# Patient Record
Sex: Male | Born: 1950 | Race: White | Hispanic: No | Marital: Married | Smoking: Never smoker
Health system: Southern US, Community
[De-identification: ages and names within clinical notes are randomized; demographics above are authoritative.]

## PROBLEM LIST (undated history)

## (undated) DIAGNOSIS — M1990 Unspecified osteoarthritis, unspecified site: Secondary | ICD-10-CM

## (undated) DIAGNOSIS — I219 Acute myocardial infarction, unspecified: Secondary | ICD-10-CM

## (undated) DIAGNOSIS — G4730 Sleep apnea, unspecified: Secondary | ICD-10-CM

## (undated) DIAGNOSIS — Z8719 Personal history of other diseases of the digestive system: Secondary | ICD-10-CM

## (undated) DIAGNOSIS — K219 Gastro-esophageal reflux disease without esophagitis: Secondary | ICD-10-CM

## (undated) DIAGNOSIS — I10 Essential (primary) hypertension: Secondary | ICD-10-CM

## (undated) HISTORY — PX: EYE SURGERY: SHX253

## (undated) HISTORY — PX: SPINAL CORD STIMULATOR IMPLANT: SHX2422

## (undated) HISTORY — PX: BACK SURGERY: SHX140

---

## 2012-08-13 HISTORY — PX: CORONARY STENT PLACEMENT: SHX1402

## 2017-07-09 ENCOUNTER — Other Ambulatory Visit: Payer: Self-pay | Admitting: Orthopaedic Surgery

## 2017-07-09 DIAGNOSIS — M4326 Fusion of spine, lumbar region: Secondary | ICD-10-CM

## 2017-09-20 NOTE — Patient Instructions (Signed)
Patient notified 13-hr prep called in to his pharmacy in Epic.  Prednisone 50mg  PO 10/03/17 @ 2030, 10/04/17 @ 0230 and 0830.  Benadryl 50mg  PO 10/04/17 @ 0830.  Knows to stop Plavix 09/28/17.  Donell SievertJeanne Vishal Sandlin, RN

## 2017-10-04 ENCOUNTER — Other Ambulatory Visit: Payer: Self-pay

## 2017-10-04 ENCOUNTER — Inpatient Hospital Stay
Admission: RE | Admit: 2017-10-04 | Discharge: 2017-10-04 | Disposition: A | Discharge status: Home / Self Care | Payer: Self-pay | Source: Ambulatory Visit | Attending: Orthopaedic Surgery | Admitting: Orthopaedic Surgery

## 2017-10-04 NOTE — Discharge Instructions (Signed)

## 2017-10-16 ENCOUNTER — Ambulatory Visit
Admission: RE | Admit: 2017-10-16 | Discharge: 2017-10-16 | Disposition: A | Discharge status: Home / Self Care | Payer: Medicare Other | Source: Ambulatory Visit | Attending: Orthopaedic Surgery | Admitting: Orthopaedic Surgery

## 2017-10-16 DIAGNOSIS — M4326 Fusion of spine, lumbar region: Secondary | ICD-10-CM

## 2017-10-16 MED ORDER — DIAZEPAM 5 MG PO TABS
5.0000 mg | ORAL_TABLET | Freq: Once | ORAL | Status: AC
  Administered 2017-10-16: 5 mg via ORAL

## 2017-10-16 MED ORDER — IOPAMIDOL (ISOVUE-M 300) INJECTION 61%
10.0000 mL | Freq: Once | INTRAMUSCULAR | Status: AC | PRN
  Administered 2017-10-16: 10 mL via INTRATHECAL

## 2017-10-16 NOTE — Discharge Instructions (Signed)

## 2020-11-09 ENCOUNTER — Ambulatory Visit: Payer: Medicare Other | Admitting: Orthopaedic Surgery

## 2020-11-09 ENCOUNTER — Encounter: Payer: Self-pay | Admitting: Orthopaedic Surgery

## 2020-11-09 ENCOUNTER — Ambulatory Visit: Payer: Self-pay

## 2020-11-09 VITALS — Ht 72.0 in | Wt 273.0 lb

## 2020-11-09 DIAGNOSIS — M1611 Unilateral primary osteoarthritis, right hip: Secondary | ICD-10-CM | POA: Diagnosis not present

## 2020-11-09 DIAGNOSIS — M25551 Pain in right hip: Secondary | ICD-10-CM

## 2020-11-09 NOTE — Progress Notes (Signed)
Office Visit Note   Patient: Jordan [name]           Date of Birth: 04-Aug-1951           MRN: [id number] Visit Date: 11/09/2020              Requested by: Stefanie Libel, MD [address] [place] ,  [place] [address] PCP: Stefanie Libel, MD   Assessment & Plan: Visit Diagnoses:  1. Pain in right hip   2. Unilateral primary osteoarthritis, right hip     Plan: I did speak to him in length in detail about hip replacement surgery.  I went over his x-rays in detail and described what the surgery involves.  He would need to stop Plavix for a week prior to surgery.  I gave him a handout about hip replacement surgery and showed him a hip model.  We would likely need to place a dual mobility acetabular component given his spinal fusion.  The risk and benefits of surgery were explained in detail and I talked about his intraoperative and postoperative course and what to expect.  He is going to hold off until may be later in the year.  He has her surgery scheduler's card and if things worsen and he would like to have something scheduled he will let us know.  All questions and concerns were answered and addressed.  Follow-Up Instructions: Return if symptoms worsen or fail to improve.   Orders:  Orders Placed This Encounter  Procedures  . XR HIP UNILAT W OR W/O PELVIS 1V RIGHT   No orders of the defined types were placed in this encounter.     Procedures: No procedures performed   Clinical Data: No additional findings.   Subjective: Chief Complaint  Patient presents with  . Right Hip - Pain  Patient is a very pleasant 70 year old gentleman sent by Dr. Retia Passe of spine and scoliosis specialist to evaluate and treat severe arthritis in the right hip.  He has been dealing with this for many years now.  An intra-articular injection of a steroid in the right hip did help temporize his symptoms for a little while.  He has a significant spine history in terms of a lumbar fusion that extends all the way  into the pelvis and the SI joints.  His pain in his right hip is is in his groin.  He does still have some back pain.  He is not a diabetic.  He is on Plavix.  His wife is with him today.  He does walk with a cane.  He is worked on activity modification and weight loss.  He cannot take anti-inflammatories since being on Plavix.  He has been through therapy as well.  At this point his hip pain is detriment affecting his mobility, his quality of life and his activities of daily living.  HPI  Review of Systems He currently denies any headache, chest pain, short of breath, fever, chills, nausea, vomiting  Objective: Vital Signs: Ht 6' (1.829 m)   Wt 273 lb (123.8 kg)   BMI 37.03 kg/m   Physical Exam He is alert and orient x3 and in no acute distress Ortho Exam Examination of his left hip shows it moves smoothly and fluidly.  Examination of the right hip shows significant stiffness with flexion and extension as well as internal and external rotation and pain in the groin. I did watch him walk based on his spinal fusion for making a better assessment  about what hip replacement we were appropriate for him in terms of the acetabular component positioning given his fusion to the sacrum. Specialty Comments:  No specialty comments available.  Imaging: No results found.   PMFS History: Patient Active Problem List   Diagnosis Date Noted  . Unilateral primary osteoarthritis, right hip 11/09/2020   No past medical history on file.  No family history on file.   Social History   Occupational History  . Not on file  Tobacco Use  . Smoking status: Not on file  . Smokeless tobacco: Not on file  Substance and Sexual Activity  . Alcohol use: Not on file  . Drug use: Not on file  . Sexual activity: Not on file

## 2020-12-12 ENCOUNTER — Other Ambulatory Visit: Payer: Self-pay

## 2020-12-29 ENCOUNTER — Other Ambulatory Visit: Payer: Self-pay | Admitting: Physician Assistant

## 2021-01-13 NOTE — Pre-Procedure Instructions (Addendum)
Surgical Instructions    Your procedure is scheduled on Tuesday June 7th.  Report to [facility] Main Entrance "A" at 09:45 A.M., then check in with the Admitting office.  Call this number if you have problems the morning of surgery:  [phone]   If you have any questions prior to your surgery date call [phone]: Open Monday-Friday 8am-4pm    Remember:  Do not eat after midnight the night before your surgery  You may drink clear liquids until 08:45 the morning of your surgery.   Clear liquids allowed are: Water, Non-Citrus Juices (without pulp), Carbonated Beverages, Clear Tea, Black Coffee Only, and Gatorade Patient Instructions  . The night before surgery:  o No food after midnight. ONLY clear liquids after midnight  . The day of surgery (if you do NOT have diabetes):  o Drink ONE (1) Pre-Surgery Clear Ensure by 08:45 the morning of surgery. Drink in one sitting. Do not sip.  o This drink was given to you during your hospital  pre-op appointment visit.  o Nothing else to drink after completing the  Pre-Surgery Clear Ensure.         If you have questions, please contact your surgeon's office.     Take these medicines the morning of surgery with A SIP OF WATER   amLODipine (NORVASC)  cyclobenzaprine (FLEXERIL)- If needed  metoprolol succinate (TOPROL-XL)  omeprazole (PRILOSEC)  pravastatin (PRAVACHOL)  tamsulosin (FLOMAX)    As of today, STOP taking any Aspirin (unless otherwise instructed by your surgeon) Aleve, Naproxen, Ibuprofen, Motrin, Advil, Goody's, BC's, all herbal medications, fish oil, and all vitamins.  Per your doctor's instructions hold Plavix for 5 days prior to surgery date.                      Do NOT Smoke (Tobacco/Vaping) or drink Alcohol 24 hours prior to your procedure.  If you use a CPAP at night, you may bring all equipment for your overnight stay.   Contacts, glasses, piercing's, hearing aid's, dentures or partials may not be worn into  surgery, please bring cases for these belongings.    For patients admitted to the hospital, discharge time will be determined by your treatment team.   Patients discharged the day of surgery will not be allowed to drive home, and someone needs to stay with them for 24 hours.    Special instructions:   - Preparing For Surgery  Before surgery, you can play an important role. Because skin is not sterile, your skin needs to be as free of germs as possible. You can reduce the number of germs on your skin by washing with CHG (chlorahexidine gluconate) Soap before surgery.  CHG is an antiseptic cleaner which kills germs and bonds with the skin to continue killing germs even after washing.    Oral Hygiene is also important to reduce your risk of infection.  Remember - BRUSH YOUR TEETH THE MORNING OF SURGERY WITH YOUR REGULAR TOOTHPASTE  Please do not use if you have an allergy to CHG or antibacterial soaps. If your skin becomes reddened/irritated stop using the CHG.  Do not shave (including legs and underarms) for at least 48 hours prior to first CHG shower. It is OK to shave your face.  Please follow these instructions carefully.   1. Shower the NIGHT BEFORE SURGERY and the MORNING OF SURGERY  2. If you chose to wash your hair, wash your hair first as usual with your normal shampoo.  3. After you shampoo, rinse your hair and body thoroughly to remove the shampoo.  4. Use CHG Soap as you would any other liquid soap. You can apply CHG directly to the skin and wash gently with a scrungie or a clean washcloth.   5. Apply the CHG Soap to your body ONLY FROM THE NECK DOWN.  Do not use on open wounds or open sores. Avoid contact with your eyes, ears, mouth and genitals (private parts). Wash Face and genitals (private parts)  with your normal soap.   6. Wash thoroughly, paying special attention to the area where your surgery will be performed.  7. Thoroughly rinse your body with warm  water from the neck down.  8. DO NOT shower/wash with your normal soap after using and rinsing off the CHG Soap.  9. Pat yourself dry with a CLEAN TOWEL.  10. Wear CLEAN PAJAMAS to bed the night before surgery  11. Place CLEAN SHEETS on your bed the night before your surgery  12. DO NOT SLEEP WITH PETS.   Day of Surgery: Shower with CHG soap. Do not wear jewelry, make up, or nail polish Do not wear lotions, powders, colognes, or deodorant. Do not shave 48 hours prior to surgery.  Men may shave face and neck. Do not bring valuables to the hospital. [facility] is not responsible for any belongings or valuables. Wear Clean/Comfortable clothing the morning of surgery Remember to brush your teeth WITH YOUR REGULAR TOOTHPASTE.   Please read over the following fact sheets that you were given.

## 2021-01-16 ENCOUNTER — Encounter (HOSPITAL_COMMUNITY): Payer: Self-pay

## 2021-01-16 ENCOUNTER — Encounter (HOSPITAL_COMMUNITY)
Admission: RE | Admit: 2021-01-16 | Discharge: 2021-01-16 | Disposition: A | Discharge status: Home / Self Care | Payer: Medicare Other | Source: Ambulatory Visit | Attending: Orthopaedic Surgery | Admitting: Orthopaedic Surgery

## 2021-01-16 ENCOUNTER — Other Ambulatory Visit: Payer: Self-pay

## 2021-01-16 DIAGNOSIS — Z20822 Contact with and (suspected) exposure to covid-19: Secondary | ICD-10-CM | POA: Insufficient documentation

## 2021-01-16 DIAGNOSIS — Z01812 Encounter for preprocedural laboratory examination: Secondary | ICD-10-CM | POA: Insufficient documentation

## 2021-01-16 HISTORY — DX: Essential (primary) hypertension: I10

## 2021-01-16 HISTORY — DX: Gastro-esophageal reflux disease without esophagitis: K21.9

## 2021-01-16 HISTORY — DX: Acute myocardial infarction, unspecified: I21.9

## 2021-01-16 HISTORY — DX: Sleep apnea, unspecified: G47.30

## 2021-01-16 HISTORY — DX: Unspecified osteoarthritis, unspecified site: M19.90

## 2021-01-16 LAB — BASIC METABOLIC PANEL
Anion gap: 9 (ref 5–15)
BUN: 21 mg/dL (ref 8–23)
CO2: 24 mmol/L (ref 22–32)
Calcium: 9.2 mg/dL (ref 8.9–10.3)
Chloride: 105 mmol/L (ref 98–111)
Creatinine, Ser: 1 mg/dL (ref 0.61–1.24)
GFR, Estimated: >60 mL/min (ref >60)
Glucose, Bld: 131 mg/dL — ABNORMAL HIGH (ref 70–99)
Potassium: 3.5 mmol/L (ref 3.5–5.1)
Sodium: 138 mmol/L (ref 135–145)

## 2021-01-16 LAB — CBC
HCT: 43.8 % (ref 39.0–52.0)
Hemoglobin: 14.2 g/dL (ref 13.0–17.0)
MCH: 30.7 pg (ref 26.0–34.0)
MCHC: 32.4 g/dL (ref 30.0–36.0)
MCV: 94.8 fL (ref 80.0–100.0)
Platelets: 243 10*3/uL (ref 150–400)
RBC: 4.62 MIL/uL (ref 4.22–5.81)
RDW: 13.2 % (ref 11.5–15.5)
WBC: 6.6 10*3/uL (ref 4.0–10.5)
nRBC: 0 % (ref 0.0–0.2)

## 2021-01-16 LAB — TYPE AND SCREEN
ABO/RH(D): O POS
Antibody Screen: NEGATIVE

## 2021-01-16 LAB — SURGICAL PCR SCREEN
MRSA, PCR: NEGATIVE
Staphylococcus aureus: NEGATIVE

## 2021-01-16 NOTE — Progress Notes (Addendum)
PCP - Dr. Stefanie Libel Cardiologist - Dr. Lorelei Pont  PPM/ICD - n/a Device Orders - n/a Rep Notified - n/a  Chest x-ray -  EKG - 10/25/20 at [facility] Stress Test -  ECHO - 05/28/18 Cardiac Cath -   Sleep Study - Yes. Over 20 years ago. Sleep Apnea CPAP - Wears nightly  Fasting Blood Sugar - n/a   Blood Thinner Instructions: Patient states he stopped taking Aspirin and Plavix on 01/09/20  ERAS Protcol - Yes PRE-SURGERY Ensure or G2- Ensure  COVID TEST- 01/16/21. Pending   Anesthesia review: Yes. EKG tracing and ECHO and stress requested from [facility] [place]. Message sent to Jaynie Collins, PA to make aware patient is for anesthesia review.   Patient denies shortness of breath, fever, cough and chest pain at PAT appointment   All instructions explained to the patient, with a verbal understanding of the material. Patient agrees to go over the instructions while at home for a better understanding. Patient also instructed to self quarantine after being tested for COVID-19. The opportunity to ask questions was provided.

## 2021-01-16 NOTE — Progress Notes (Signed)
Anesthesia Chart Review:  Case: [id number] Date/Time: 01/17/21 1130   Procedure: RIGHT TOTAL HIP ARTHROPLASTY ANTERIOR APPROACH (Right Hip)   Anesthesia type: Spinal   Pre-op diagnosis: Osteoarthritis Right Hip   [facility] ROOM 04 / [facility]   Surgeons: Kathryne Hitch, MD      DISCUSSION: Patient is a 70 year old male scheduled for the above procedure.  History includes never smoker, HTN, dyslipidemia, CAD (NSTEMI, s/p DES x2 LAD 09/30/13), OSA (uses CPAP), GERD, back surgery. BMI is consistent with obesity.  Last cardiology visit with Dr. Willeen Cass on 10/25/20. He was doing well without chest pain. Patient had been of cardiac medications, so Toprol and pravastatin resumed. Six month follow-up planned.  He signed a note of clearance for surgery with permission to hold Plavix for 5 days prior to surgery.   He reported last ASA and Plavix 5/29.   01/16/21 presurgical COVID-19 test in process. Anesthesia team to evaluate on the day of surgery.   VS: BP 138/70   Pulse 67   Temp 36.9 C (Oral)   Resp 18   Ht 5\' 11"  (1.803 m)   Wt 125.1 kg   SpO2 99%   BMI 38.48 kg/m   PROVIDERS: , MD is PCP Stefanie Libel, MD is cardiologist ([facility])   LABS: Labs reviewed: Acceptable for surgery. (all labs ordered are listed, but only abnormal results are displayed)  Labs Reviewed  BASIC METABOLIC PANEL - Abnormal; Notable for the following components:      Result Value   Glucose, Bld 131 (*)    All other components within normal limits  SURGICAL PCR SCREEN  SARS CORONAVIRUS 2 (TAT 6-24 HRS)  CBC  TYPE AND SCREEN     EKG: 10/25/20: NSR, RBBB   CV: Echo 03/16/20 ([facility]): LeftVentricle: The calculated left ventricular ejection fraction is  61%.  . LeftVentricle: There is moderate concentric hypertrophy.  . LeftVentricle: Systolic function is normal. EF: 60-65%. ; GLS = 22.7%  from the apical 4,3,2 chamber views respectively.  05/16/20 LeftVentricle: Doppler parameters  are indeterminate for diastolic  function.  . RightVentricle: Right ventricle is mildly dilated.  . MitralValve: The leaflets are mildly thickened.  . Aorta: The ascending aorta is mildly dilated.  . TricuspidValve: The right ventricular systolic pressure is normal (<36  mmHg).  . PulmonicValve: The pulmonic valve was not well visualized.   Exercise stress echo 06/13/15 [facility]): STRESS ECHO  Normal left ventricular function and global wall motion with stress. There  were no segmental wall motion abnormalities post exercise. There was normal  increase in global LV function post exercise. The estimated LV ejection  fraction is 60-65% with stress. Negative exercise echocardiography for  inducible ischemia at subtarget heart rate achieved.    Cardiac cath 04/22/14 ([facility]): Impression:  1. Patent LAD stent. Borderline lesion noted in the proximal portion of the IM branch. Mild RCA lesion noted  2. Normal LV function   Plan:  1. Aggressive medical therapy.Would consider pressure wire measurement if he continues to have chest pain.  2. Risk stratification   Cardiac cath 09/30/13 ([facility]): Findings:  1. Hemodynamics: Aortic pressure 174/71, LVEDP 22  2. Coronary system:  Left Main: Large caliber vessel with no angiographic evidence of stenosis.  LAD system: Large caliber vessel, wraps around the apex. 540% ostial disease, 99% proximal stenosis with probable thrombus  LCX system: moderate caliber vessel.40% proximal disease.  RCA system: right dominant. 60% distal stenosis.   Conclusion:  1. Successful intervention to the LAD artery using 2.75x12 and 2.75x18 mm Alpine DESstent. Lesion description: Type C, eccentric, lesion length about 22 mm.Pre-intervention stenosis 99% with probable thrombus, post-intervention stenosis 0 %    Past Medical History:  Diagnosis Date  . Arthritis    Back, hand, elbows, knees, hips  . GERD (gastroesophageal reflux  disease)   . Hypertension   . Myocardial infarction (HCC)   . Sleep apnea     Past Surgical History:  Procedure Laterality Date  . BACK SURGERY     Lower back fusion  . CORONARY STENT PLACEMENT  2014  . EYE SURGERY     Bilateral cataract removal    MEDICATIONS: . amLODipine (NORVASC) 10 MG tablet  . aspirin 81 MG chewable tablet  . clopidogrel (PLAVIX) 75 MG tablet  . clotrimazole-betamethasone (LOTRISONE) cream  . cyclobenzaprine (FLEXERIL) 10 MG tablet  . furosemide (LASIX) 20 MG tablet  . HYDROcodone-ibuprofen (VICOPROFEN) 7.5-200 MG tablet  . losartan (COZAAR) 100 MG tablet  . metoprolol succinate (TOPROL-XL) 50 MG 24 hr tablet  . naproxen (NAPROSYN) 500 MG tablet  . omeprazole (PRILOSEC) 20 MG capsule  . pravastatin (PRAVACHOL) 20 MG tablet  . Sennosides 25 MG TABS  . tamsulosin (FLOMAX) 0.4 MG CAPS capsule   No current facility-administered medications for this encounter.    [name], PA-C Surgical Short Stay/Anesthesiology [facility] Phone [phone] [facility] Phone [phone] 01/16/2021 3:50 PM

## 2021-01-16 NOTE — Anesthesia Preprocedure Evaluation (Addendum)
Anesthesia Evaluation  Patient identified by MRN, date of birth, ID band Patient awake    Reviewed: Allergy & Precautions, NPO status , Patient's Chart, lab work & pertinent test results  Airway Mallampati: III  TM Distance: >3 FB Neck ROM: Full    Dental  (+) Teeth Intact, Dental Advisory Given   Pulmonary sleep apnea ,    breath sounds clear to auscultation       Cardiovascular hypertension, Pt. on medications and Pt. on home beta blockers + Past MI   Rhythm:Regular Rate:Normal     Neuro/Psych negative neurological ROS  negative psych ROS   GI/Hepatic Neg liver ROS, GERD  Medicated,  Endo/Other  negative endocrine ROS  Renal/GU negative Renal ROS     Musculoskeletal  (+) Arthritis ,   Abdominal Normal abdominal exam  (+)   Peds  Hematology negative hematology ROS (+)   Anesthesia Other Findings   Reproductive/Obstetrics                           Anesthesia Physical Anesthesia Plan  ASA: II  Anesthesia Plan: Spinal   Post-op Pain Management:    Induction: Intravenous  PONV Risk Score and Plan: 2 and Ondansetron and Propofol infusion  Airway Management Planned: Natural Airway and Simple Face Mask  Additional Equipment: None  Intra-op Plan:   Post-operative Plan:   Informed Consent: I have reviewed the patients History and Physical, chart, labs and discussed the procedure including the risks, benefits and alternatives for the proposed anesthesia with the patient or authorized representative who has indicated his/her understanding and acceptance.       Plan Discussed with: CRNA  Anesthesia Plan Comments: (PAT note written 01/16/2021 by [name], PA-C. )       Anesthesia Quick Evaluation

## 2021-01-17 ENCOUNTER — Encounter (HOSPITAL_COMMUNITY)
Admission: RE | Disposition: A | Discharge status: Home / Self Care | Payer: Self-pay | Source: Home / Self Care | Attending: Orthopaedic Surgery

## 2021-01-17 ENCOUNTER — Ambulatory Visit (HOSPITAL_COMMUNITY): Payer: Medicare Other

## 2021-01-17 ENCOUNTER — Ambulatory Visit (HOSPITAL_COMMUNITY): Payer: Medicare Other | Admitting: Vascular Surgery

## 2021-01-17 ENCOUNTER — Ambulatory Visit (HOSPITAL_COMMUNITY): Payer: Medicare Other | Admitting: Anesthesiology

## 2021-01-17 ENCOUNTER — Observation Stay (HOSPITAL_COMMUNITY)
Admission: RE | Admit: 2021-01-17 | Discharge: 2021-01-18 | Disposition: A | Discharge status: Home / Self Care | Payer: Medicare Other | Attending: Orthopaedic Surgery | Admitting: Orthopaedic Surgery

## 2021-01-17 ENCOUNTER — Encounter (HOSPITAL_COMMUNITY): Payer: Self-pay | Admitting: Orthopaedic Surgery

## 2021-01-17 ENCOUNTER — Observation Stay (HOSPITAL_COMMUNITY): Payer: Medicare Other

## 2021-01-17 DIAGNOSIS — I10 Essential (primary) hypertension: Secondary | ICD-10-CM | POA: Diagnosis not present

## 2021-01-17 DIAGNOSIS — M1611 Unilateral primary osteoarthritis, right hip: Secondary | ICD-10-CM | POA: Diagnosis not present

## 2021-01-17 DIAGNOSIS — Z955 Presence of coronary angioplasty implant and graft: Secondary | ICD-10-CM | POA: Diagnosis not present

## 2021-01-17 DIAGNOSIS — Z96641 Presence of right artificial hip joint: Secondary | ICD-10-CM

## 2021-01-17 DIAGNOSIS — Z419 Encounter for procedure for purposes other than remedying health state, unspecified: Secondary | ICD-10-CM

## 2021-01-17 HISTORY — PX: TOTAL HIP ARTHROPLASTY: SHX124

## 2021-01-17 LAB — ABO/RH: ABO/RH(D): O POS

## 2021-01-17 LAB — SARS CORONAVIRUS 2 (TAT 6-24 HRS): SARS Coronavirus 2: NEGATIVE

## 2021-01-17 SURGERY — ARTHROPLASTY, HIP, TOTAL, ANTERIOR APPROACH
Anesthesia: General | Site: Hip | Laterality: Right

## 2021-01-17 MED ORDER — AMLODIPINE BESYLATE 5 MG PO TABS
10.0000 mg | ORAL_TABLET | Freq: Every day | ORAL | Status: DC
  Administered 2021-01-17: 10 mg via ORAL
  Filled 2021-01-17 (×2): qty 2

## 2021-01-17 MED ORDER — ACETAMINOPHEN 325 MG PO TABS: 325.0000 mg | ORAL_TABLET | Freq: Once | ORAL | Status: DC | PRN

## 2021-01-17 MED ORDER — ONDANSETRON HCL 4 MG/2ML IJ SOLN: 4.0000 mg | Freq: Four times a day (QID) | INTRAMUSCULAR | Status: DC | PRN

## 2021-01-17 MED ORDER — LACTATED RINGERS IV SOLN: INTRAVENOUS | Status: DC

## 2021-01-17 MED ORDER — MIDAZOLAM HCL 5 MG/5ML IJ SOLN
INTRAMUSCULAR | Status: DC | PRN
  Administered 2021-01-17 (×2): 1 mg via INTRAVENOUS

## 2021-01-17 MED ORDER — SENNA 8.6 MG PO TABS
8.6000 mg | ORAL_TABLET | Freq: Every day | ORAL | Status: DC
  Administered 2021-01-17 – 2021-01-18 (×2): 8.6 mg via ORAL
  Filled 2021-01-17 (×2): qty 1

## 2021-01-17 MED ORDER — OXYCODONE HCL 5 MG PO TABS
10.0000 mg | ORAL_TABLET | ORAL | Status: DC | PRN
  Filled 2021-01-17: qty 3

## 2021-01-17 MED ORDER — PHENYLEPHRINE 40 MCG/ML (10ML) SYRINGE FOR IV PUSH (FOR BLOOD PRESSURE SUPPORT)
PREFILLED_SYRINGE | INTRAVENOUS | Status: AC
  Filled 2021-01-17: qty 10

## 2021-01-17 MED ORDER — LOSARTAN POTASSIUM 50 MG PO TABS
100.0000 mg | ORAL_TABLET | Freq: Every day | ORAL | Status: DC
  Administered 2021-01-17 – 2021-01-18 (×2): 100 mg via ORAL
  Filled 2021-01-17 (×2): qty 2

## 2021-01-17 MED ORDER — CLOTRIMAZOLE-BETAMETHASONE 1-0.05 % EX CREA
1.0000 "application " | TOPICAL_CREAM | Freq: Every day | CUTANEOUS | Status: DC
  Filled 2021-01-17: qty 15

## 2021-01-17 MED ORDER — 0.9 % SODIUM CHLORIDE (POUR BTL) OPTIME
TOPICAL | Status: DC | PRN
  Administered 2021-01-17: 1000 mL

## 2021-01-17 MED ORDER — FLUCONAZOLE 150 MG PO TABS
150.0000 mg | ORAL_TABLET | Freq: Once | ORAL | Status: AC
  Administered 2021-01-17: 150 mg via ORAL
  Filled 2021-01-17: qty 1

## 2021-01-17 MED ORDER — DEXAMETHASONE SODIUM PHOSPHATE 10 MG/ML IJ SOLN
INTRAMUSCULAR | Status: DC | PRN
  Administered 2021-01-17: 5 mg via INTRAVENOUS

## 2021-01-17 MED ORDER — AMISULPRIDE (ANTIEMETIC) 5 MG/2ML IV SOLN: 10.0000 mg | Freq: Once | INTRAVENOUS | Status: DC | PRN

## 2021-01-17 MED ORDER — HYDROMORPHONE HCL 1 MG/ML IJ SOLN
INTRAMUSCULAR | Status: AC
  Filled 2021-01-17: qty 1

## 2021-01-17 MED ORDER — ROCURONIUM BROMIDE 10 MG/ML (PF) SYRINGE
PREFILLED_SYRINGE | INTRAVENOUS | Status: AC
  Filled 2021-01-17: qty 20

## 2021-01-17 MED ORDER — DEXAMETHASONE SODIUM PHOSPHATE 10 MG/ML IJ SOLN
INTRAMUSCULAR | Status: AC
  Filled 2021-01-17: qty 1

## 2021-01-17 MED ORDER — TRANEXAMIC ACID-NACL 1000-0.7 MG/100ML-% IV SOLN
1000.0000 mg | INTRAVENOUS | Status: AC
  Administered 2021-01-17: 1000 mg via INTRAVENOUS

## 2021-01-17 MED ORDER — METOPROLOL SUCCINATE ER 50 MG PO TB24
50.0000 mg | ORAL_TABLET | Freq: Every day | ORAL | Status: DC
  Administered 2021-01-17 – 2021-01-18 (×2): 50 mg via ORAL
  Filled 2021-01-17 (×2): qty 1

## 2021-01-17 MED ORDER — SODIUM CHLORIDE 0.9 % IV SOLN: INTRAVENOUS | Status: DC

## 2021-01-17 MED ORDER — ONDANSETRON HCL 4 MG/2ML IJ SOLN
INTRAMUSCULAR | Status: DC | PRN
  Administered 2021-01-17: 4 mg via INTRAVENOUS

## 2021-01-17 MED ORDER — PANTOPRAZOLE SODIUM 40 MG PO TBEC
40.0000 mg | DELAYED_RELEASE_TABLET | Freq: Every day | ORAL | Status: DC
  Administered 2021-01-17: 40 mg via ORAL
  Filled 2021-01-17 (×2): qty 1

## 2021-01-17 MED ORDER — SUCCINYLCHOLINE CHLORIDE 200 MG/10ML IV SOSY
PREFILLED_SYRINGE | INTRAVENOUS | Status: AC
  Filled 2021-01-17: qty 10

## 2021-01-17 MED ORDER — HYDROMORPHONE HCL 1 MG/ML IJ SOLN
0.2500 mg | INTRAMUSCULAR | Status: DC | PRN
  Administered 2021-01-17: 0.5 mg via INTRAVENOUS

## 2021-01-17 MED ORDER — PHENOL 1.4 % MT LIQD: 1.0000 | OROMUCOSAL | Status: DC | PRN

## 2021-01-17 MED ORDER — FUROSEMIDE 20 MG PO TABS
20.0000 mg | ORAL_TABLET | Freq: Every day | ORAL | Status: DC
  Administered 2021-01-17 – 2021-01-18 (×2): 20 mg via ORAL
  Filled 2021-01-17 (×2): qty 1

## 2021-01-17 MED ORDER — HYDROMORPHONE HCL 1 MG/ML IJ SOLN
INTRAMUSCULAR | Status: AC
  Filled 2021-01-17: qty 2

## 2021-01-17 MED ORDER — ACETAMINOPHEN 10 MG/ML IV SOLN
INTRAVENOUS | Status: AC
  Filled 2021-01-17: qty 100

## 2021-01-17 MED ORDER — MEPERIDINE HCL 25 MG/ML IJ SOLN: 6.2500 mg | INTRAMUSCULAR | Status: DC | PRN

## 2021-01-17 MED ORDER — CYCLOBENZAPRINE HCL 10 MG PO TABS
ORAL_TABLET | ORAL | Status: AC
  Filled 2021-01-17: qty 1

## 2021-01-17 MED ORDER — FENTANYL CITRATE (PF) 250 MCG/5ML IJ SOLN
INTRAMUSCULAR | Status: DC | PRN
  Administered 2021-01-17 (×2): 50 ug via INTRAVENOUS
  Administered 2021-01-17: 25 ug via INTRAVENOUS
  Administered 2021-01-17: 125 ug via INTRAVENOUS

## 2021-01-17 MED ORDER — DIPHENHYDRAMINE HCL 12.5 MG/5ML PO ELIX
12.5000 mg | ORAL_SOLUTION | ORAL | Status: DC | PRN
  Filled 2021-01-17: qty 10

## 2021-01-17 MED ORDER — CEFAZOLIN SODIUM-DEXTROSE 2-4 GM/100ML-% IV SOLN
2.0000 g | Freq: Four times a day (QID) | INTRAVENOUS | Status: AC
  Administered 2021-01-17 (×2): 2 g via INTRAVENOUS
  Filled 2021-01-17 (×2): qty 100

## 2021-01-17 MED ORDER — OXYCODONE HCL 5 MG PO TABS
ORAL_TABLET | ORAL | Status: AC
  Filled 2021-01-17: qty 2

## 2021-01-17 MED ORDER — PROPOFOL 10 MG/ML IV BOLUS
INTRAVENOUS | Status: AC
  Filled 2021-01-17: qty 20

## 2021-01-17 MED ORDER — PROPOFOL 500 MG/50ML IV EMUL: INTRAVENOUS | Status: DC | PRN

## 2021-01-17 MED ORDER — HYDROMORPHONE HCL 1 MG/ML IJ SOLN
INTRAMUSCULAR | Status: AC
  Filled 2021-01-17: qty 0.5

## 2021-01-17 MED ORDER — ORAL CARE MOUTH RINSE: 15.0000 mL | Freq: Once | OROMUCOSAL | Status: AC

## 2021-01-17 MED ORDER — ASPIRIN 81 MG PO CHEW
81.0000 mg | CHEWABLE_TABLET | Freq: Every day | ORAL | Status: DC
  Administered 2021-01-17 – 2021-01-18 (×2): 81 mg via ORAL
  Filled 2021-01-17 (×2): qty 1

## 2021-01-17 MED ORDER — METOCLOPRAMIDE HCL 5 MG/ML IJ SOLN: 5.0000 mg | Freq: Three times a day (TID) | INTRAMUSCULAR | Status: DC | PRN

## 2021-01-17 MED ORDER — ROCURONIUM BROMIDE 100 MG/10ML IV SOLN
INTRAVENOUS | Status: DC | PRN
  Administered 2021-01-17: 20 mg via INTRAVENOUS
  Administered 2021-01-17: 50 mg via INTRAVENOUS
  Administered 2021-01-17: 30 mg via INTRAVENOUS

## 2021-01-17 MED ORDER — METOCLOPRAMIDE HCL 5 MG PO TABS
5.0000 mg | ORAL_TABLET | Freq: Three times a day (TID) | ORAL | Status: DC | PRN
Start: 2021-01-17 — End: 2021-01-18

## 2021-01-17 MED ORDER — ACETAMINOPHEN 325 MG PO TABS: 325.0000 mg | ORAL_TABLET | Freq: Four times a day (QID) | ORAL | Status: DC | PRN

## 2021-01-17 MED ORDER — CLOPIDOGREL BISULFATE 75 MG PO TABS
75.0000 mg | ORAL_TABLET | Freq: Every day | ORAL | Status: DC
  Filled 2021-01-17: qty 1

## 2021-01-17 MED ORDER — DOCUSATE SODIUM 100 MG PO CAPS
100.0000 mg | ORAL_CAPSULE | Freq: Two times a day (BID) | ORAL | Status: DC
  Administered 2021-01-17 – 2021-01-18 (×2): 100 mg via ORAL
  Filled 2021-01-17 (×2): qty 1

## 2021-01-17 MED ORDER — LIDOCAINE HCL (CARDIAC) PF 100 MG/5ML IV SOSY
PREFILLED_SYRINGE | INTRAVENOUS | Status: DC | PRN
  Administered 2021-01-17: 80 mg via INTRAVENOUS

## 2021-01-17 MED ORDER — FENTANYL CITRATE (PF) 100 MCG/2ML IJ SOLN: INTRAMUSCULAR | Status: DC | PRN

## 2021-01-17 MED ORDER — FENTANYL CITRATE (PF) 250 MCG/5ML IJ SOLN
INTRAMUSCULAR | Status: AC
  Filled 2021-01-17: qty 5

## 2021-01-17 MED ORDER — ACETAMINOPHEN 10 MG/ML IV SOLN: 1000.0000 mg | Freq: Once | INTRAVENOUS | Status: DC | PRN

## 2021-01-17 MED ORDER — CHLORHEXIDINE GLUCONATE 0.12 % MT SOLN
OROMUCOSAL | Status: AC
  Administered 2021-01-17: 15 mL via OROMUCOSAL
  Filled 2021-01-17: qty 15

## 2021-01-17 MED ORDER — TAMSULOSIN HCL 0.4 MG PO CAPS
0.4000 mg | ORAL_CAPSULE | Freq: Every day | ORAL | Status: DC
  Administered 2021-01-18: 0.4 mg via ORAL
  Filled 2021-01-17: qty 1

## 2021-01-17 MED ORDER — CEFAZOLIN IN SODIUM CHLORIDE 3-0.9 GM/100ML-% IV SOLN
3.0000 g | INTRAVENOUS | Status: AC
  Administered 2021-01-17: 3 g via INTRAVENOUS
  Filled 2021-01-17: qty 100

## 2021-01-17 MED ORDER — HYDROMORPHONE HCL 1 MG/ML IJ SOLN
0.5000 mg | INTRAMUSCULAR | Status: DC | PRN
  Administered 2021-01-17: 1 mg via INTRAVENOUS
  Filled 2021-01-17: qty 1

## 2021-01-17 MED ORDER — HYDROMORPHONE HCL 1 MG/ML IJ SOLN
INTRAMUSCULAR | Status: DC | PRN
  Administered 2021-01-17: .5 mg via INTRAVENOUS

## 2021-01-17 MED ORDER — HYDROMORPHONE HCL 1 MG/ML IJ SOLN
0.2500 mg | INTRAMUSCULAR | Status: DC | PRN
  Administered 2021-01-17 (×4): 0.5 mg via INTRAVENOUS

## 2021-01-17 MED ORDER — CEFAZOLIN IN SODIUM CHLORIDE 3-0.9 GM/100ML-% IV SOLN
INTRAVENOUS | Status: AC
  Filled 2021-01-17: qty 100

## 2021-01-17 MED ORDER — PRAVASTATIN SODIUM 10 MG PO TABS
20.0000 mg | ORAL_TABLET | Freq: Every day | ORAL | Status: DC
  Administered 2021-01-17: 20 mg via ORAL
  Filled 2021-01-17 (×2): qty 2

## 2021-01-17 MED ORDER — MIDAZOLAM HCL 2 MG/2ML IJ SOLN
INTRAMUSCULAR | Status: AC
  Filled 2021-01-17: qty 2

## 2021-01-17 MED ORDER — TRANEXAMIC ACID-NACL 1000-0.7 MG/100ML-% IV SOLN
INTRAVENOUS | Status: AC
  Filled 2021-01-17: qty 100

## 2021-01-17 MED ORDER — CHLORHEXIDINE GLUCONATE 0.12 % MT SOLN: 15.0000 mL | Freq: Once | OROMUCOSAL | Status: AC

## 2021-01-17 MED ORDER — ACETAMINOPHEN 160 MG/5ML PO SOLN
325.0000 mg | Freq: Once | ORAL | Status: DC | PRN
Start: 2021-01-17 — End: 2021-01-17

## 2021-01-17 MED ORDER — ACETAMINOPHEN 10 MG/ML IV SOLN
INTRAVENOUS | Status: DC | PRN
  Administered 2021-01-17: 1000 mg via INTRAVENOUS

## 2021-01-17 MED ORDER — SUCCINYLCHOLINE CHLORIDE 200 MG/10ML IV SOSY
PREFILLED_SYRINGE | INTRAVENOUS | Status: DC | PRN
  Administered 2021-01-17: 120 mg via INTRAVENOUS

## 2021-01-17 MED ORDER — POVIDONE-IODINE 10 % EX SWAB
2.0000 "application " | Freq: Once | CUTANEOUS | Status: AC
  Administered 2021-01-17: 2 via TOPICAL

## 2021-01-17 MED ORDER — ALUM & MAG HYDROXIDE-SIMETH 200-200-20 MG/5ML PO SUSP: 30.0000 mL | ORAL | Status: DC | PRN

## 2021-01-17 MED ORDER — PROMETHAZINE HCL 25 MG/ML IJ SOLN: 6.2500 mg | INTRAMUSCULAR | Status: DC | PRN

## 2021-01-17 MED ORDER — OXYCODONE HCL 5 MG PO TABS
5.0000 mg | ORAL_TABLET | ORAL | Status: DC | PRN
  Administered 2021-01-17 – 2021-01-18 (×2): 10 mg via ORAL
  Administered 2021-01-18 (×2): 5 mg via ORAL
  Filled 2021-01-17 (×2): qty 1
  Filled 2021-01-17 (×2): qty 2

## 2021-01-17 MED ORDER — ONDANSETRON HCL 4 MG PO TABS
4.0000 mg | ORAL_TABLET | Freq: Four times a day (QID) | ORAL | Status: DC | PRN
Start: 2021-01-17 — End: 2021-01-18

## 2021-01-17 MED ORDER — SODIUM CHLORIDE 0.9 % IR SOLN
Status: DC | PRN
  Administered 2021-01-17: 3000 mL

## 2021-01-17 MED ORDER — CYCLOBENZAPRINE HCL 10 MG PO TABS
10.0000 mg | ORAL_TABLET | Freq: Three times a day (TID) | ORAL | Status: DC | PRN
  Administered 2021-01-17 – 2021-01-18 (×3): 10 mg via ORAL
  Filled 2021-01-17 (×3): qty 1

## 2021-01-17 MED ORDER — SUGAMMADEX SODIUM 200 MG/2ML IV SOLN
INTRAVENOUS | Status: DC | PRN
  Administered 2021-01-17: 200 mg via INTRAVENOUS

## 2021-01-17 MED ORDER — PROPOFOL 10 MG/ML IV BOLUS
INTRAVENOUS | Status: DC | PRN
  Administered 2021-01-17: 150 mg via INTRAVENOUS

## 2021-01-17 MED ORDER — ONDANSETRON HCL 4 MG/2ML IJ SOLN
INTRAMUSCULAR | Status: AC
  Filled 2021-01-17: qty 2

## 2021-01-17 MED ORDER — POLYETHYLENE GLYCOL 3350 17 G PO PACK: 17.0000 g | PACK | Freq: Every day | ORAL | Status: DC | PRN

## 2021-01-17 MED ORDER — MENTHOL 3 MG MT LOZG: 1.0000 | LOZENGE | OROMUCOSAL | Status: DC | PRN

## 2021-01-17 SURGICAL SUPPLY — 53 items
ACETAB CUP W/GRIPTION 54 (Plate) ×2 IMPLANT
ARTICULEZE HEAD (Hips) ×2 IMPLANT
BENZOIN TINCTURE PRP APPL 2/3 (GAUZE/BANDAGES/DRESSINGS) ×2 IMPLANT
BLADE SAW SGTL 18X1.27X75 (BLADE) ×2 IMPLANT
CLSR STERI-STRIP ANTIMIC 1/2X4 (GAUZE/BANDAGES/DRESSINGS) ×2 IMPLANT
COLLAR OFFSET CORAIL SZ 16 HIP (Stem) ×1 IMPLANT
CORAIL OFFSET COLLAR SZ 16 HIP (Stem) ×2 IMPLANT
COVER SURGICAL LIGHT HANDLE (MISCELLANEOUS) ×2 IMPLANT
COVER WAND RF STERILE (DRAPES) ×2 IMPLANT
CUP ACETAB W/GRIPTION 54 (Plate) ×1 IMPLANT
DRAPE C-ARM 42X72 X-RAY (DRAPES) ×2 IMPLANT
DRAPE STERI IOBAN 125X83 (DRAPES) ×2 IMPLANT
DRAPE U-SHAPE 47X51 STRL (DRAPES) ×6 IMPLANT
DRSG AQUACEL AG ADV 3.5X10 (GAUZE/BANDAGES/DRESSINGS) ×2 IMPLANT
DURAPREP 26ML APPLICATOR (WOUND CARE) ×2 IMPLANT
ELECT BLADE 4.0 EZ CLEAN MEGAD (MISCELLANEOUS) ×2
ELECT BLADE 6.5 EXT (BLADE) ×2 IMPLANT
ELECT REM PT RETURN 9FT ADLT (ELECTROSURGICAL) ×2
ELECTRODE BLDE 4.0 EZ CLN MEGD (MISCELLANEOUS) ×1 IMPLANT
ELECTRODE REM PT RTRN 9FT ADLT (ELECTROSURGICAL) ×1 IMPLANT
FACESHIELD WRAPAROUND (MASK) ×4 IMPLANT
GLOVE ECLIPSE 8.0 STRL XLNG CF (GLOVE) ×2 IMPLANT
GLOVE ORTHO TXT STRL SZ7.5 (GLOVE) ×4 IMPLANT
GLOVE SRG 8 PF TXTR STRL LF DI (GLOVE) ×2 IMPLANT
GLOVE SURG UNDER POLY LF SZ8 (GLOVE) ×2
GOWN STRL REUS W/ TWL LRG LVL3 (GOWN DISPOSABLE) ×2 IMPLANT
GOWN STRL REUS W/ TWL XL LVL3 (GOWN DISPOSABLE) ×2 IMPLANT
GOWN STRL REUS W/TWL LRG LVL3 (GOWN DISPOSABLE) ×2
GOWN STRL REUS W/TWL XL LVL3 (GOWN DISPOSABLE) ×2
HANDPIECE INTERPULSE COAX TIP (DISPOSABLE) ×1
HEAD ARTICULEZE (Hips) ×1 IMPLANT
HEAD M SROM 36MM PLUS 1.5 (Hips) ×1 IMPLANT
KIT BASIN OR (CUSTOM PROCEDURE TRAY) ×2 IMPLANT
KIT TURNOVER KIT B (KITS) ×2 IMPLANT
LINER NEUTRAL 54X36MM PLUS 4 (Hips) ×2 IMPLANT
MANIFOLD NEPTUNE II (INSTRUMENTS) ×2 IMPLANT
NS IRRIG 1000ML POUR BTL (IV SOLUTION) ×2 IMPLANT
PACK TOTAL JOINT (CUSTOM PROCEDURE TRAY) ×2 IMPLANT
PAD ARMBOARD 7.5X6 YLW CONV (MISCELLANEOUS) ×2 IMPLANT
SET HNDPC FAN SPRY TIP SCT (DISPOSABLE) ×1 IMPLANT
SROM M HEAD 36MM PLUS 1.5 (Hips) ×2 IMPLANT
STRIP CLOSURE SKIN 1/2X4 (GAUZE/BANDAGES/DRESSINGS) ×4 IMPLANT
SUT ETHIBOND NAB CT1 #1 30IN (SUTURE) ×2 IMPLANT
SUT MNCRL AB 4-0 PS2 18 (SUTURE) ×2 IMPLANT
SUT VIC AB 0 CT1 27 (SUTURE) ×1
SUT VIC AB 0 CT1 27XBRD ANBCTR (SUTURE) ×1 IMPLANT
SUT VIC AB 1 CT1 27 (SUTURE) ×1
SUT VIC AB 1 CT1 27XBRD ANBCTR (SUTURE) ×1 IMPLANT
SUT VIC AB 2-0 CT1 27 (SUTURE) ×1
SUT VIC AB 2-0 CT1 TAPERPNT 27 (SUTURE) ×1 IMPLANT
TOWEL GREEN STERILE (TOWEL DISPOSABLE) ×2 IMPLANT
TOWEL GREEN STERILE FF (TOWEL DISPOSABLE) ×2 IMPLANT
WATER STERILE IRR 1000ML POUR (IV SOLUTION) ×4 IMPLANT

## 2021-01-17 NOTE — Anesthesia Procedure Notes (Signed)
Procedure Name: Intubation Date/Time: 01/17/2021 12:43 PM Performed by: Macie Burows, CRNA Pre-anesthesia Checklist: Patient identified, Emergency Drugs available, Suction available and Patient being monitored Patient Re-evaluated:Patient Re-evaluated prior to induction Oxygen Delivery Method: Circle system utilized Preoxygenation: Pre-oxygenation with 100% oxygen Induction Type: IV induction Ventilation: Mask ventilation without difficulty Laryngoscope Size: Glidescope and 4 Grade View: Grade I Tube type: Oral Tube size: 7.5 mm Number of attempts: 1 Airway Equipment and Method: Stylet and Oral airway Placement Confirmation: ETT inserted through vocal cords under direct vision,  positive ETCO2 and breath sounds checked- equal and bilateral Secured at: 22 cm Tube secured with: Tape Dental Injury: Teeth and Oropharynx as per pre-operative assessment  Comments: Inserted by Gillis Ends, SRNA

## 2021-01-17 NOTE — Transfer of Care (Signed)
Immediate Anesthesia Transfer of Care Note  Patient: Jordan [name]  Procedure(s) Performed: RIGHT TOTAL HIP ARTHROPLASTY ANTERIOR APPROACH (Right Hip)  Patient Location: PACU  Anesthesia Type:General  Level of Consciousness: drowsy and patient cooperative  Airway & Oxygen Therapy: Patient Spontanous Breathing and Patient connected to face mask oxygen  Post-op Assessment: Report given to RN and Post -op Vital signs reviewed and stable  Post vital signs: Reviewed and stable  Last Vitals:  Vitals Value Taken Time  BP 151/78 01/17/21 1501  Temp    Pulse 65 01/17/21 1510  Resp 15 01/17/21 1510  SpO2 98 % 01/17/21 1510  Vitals shown include unvalidated device data.  Last Pain:  Vitals:   01/17/21 1023  TempSrc:   PainSc: 4       Patients Stated Pain Goal: 3 (01/17/21 1023)  Complications: No complications documented.

## 2021-01-17 NOTE — Brief Op Note (Signed)
01/17/2021  2:38 PM  PATIENT:  Jordan [name]  70 y.o. male  PRE-OPERATIVE DIAGNOSIS:  Osteoarthritis Right Hip  POST-OPERATIVE DIAGNOSIS:  Osteoarthritis Right Hip  PROCEDURE:  Procedure(s) with comments: RIGHT TOTAL HIP ARTHROPLASTY ANTERIOR APPROACH (Right) - Spinal was attempted with patients consent.  Patient has spinal stimulator and unable to get access.  SURGEON:  Surgeon(s) and Role:    * Kathryne Hitch, MD - Primary  PHYSICIAN ASSISTANT:  Rexene Edison, PA-C  ANESTHESIA:   spinal and general  EBL:  400 mL   COUNTS:  YES  DICTATION: .Other Dictation: Dictation Number [id number]  PLAN OF CARE: Admit for overnight observation  PATIENT DISPOSITION:  PACU - hemodynamically stable.   Delay start of Pharmacological VTE agent (>24hrs) due to surgical blood loss or risk of bleeding: no

## 2021-01-17 NOTE — Op Note (Signed)
Jordan [name], [place] MEDICAL RECORD NO: [id number] ACCOUNT NO: [id number] DATE OF BIRTH: July 19, 1951 FACILITY: [facility] LOCATION: [facility] PHYSICIAN: Vanita Panda. Magnus Ivan, MD  Operative Report   DATE OF PROCEDURE: 01/17/2021  PREOPERATIVE DIAGNOSIS:  Primary osteoarthritis and degenerative joint disease, right hip.  POSTOPERATIVE DIAGNOSIS:  Primary osteoarthritis and degenerative joint disease, right hip.  PROCEDURE:  Right total hip arthroplasty, direct anterior approach.  IMPLANTS:  DePuy Sector Gription acetabular component size 54, size 36+4 neutral polyethylene liner, size 16 Corail femoral component with high offset, size 36+1.5 metal hip ball.  SURGEON:  Vanita Panda. Magnus Ivan, M.D.  ASSISTANT:  Richardean Canal, PA-C.  ANESTHESIA: 1.  Attempted spinal. 2.  General.  ANTIBIOTICS:  3 grams IV Ancef.  ESTIMATED BLOOD LOSS:  400 mL  COMPLICATIONS:  None.  INDICATIONS:  The patient is a 70 year old gentleman with debilitating arthritis involving his right hip.  It is causing daily pain and it is detrimentally affecting his mobility, his quality of life and his activities of daily living.  He does have a  significant spinal fusion all the way into the pelvis, but he does not walk significantly leaned over.  At this point, he wished to proceed with a total hip arthroplasty given the failure of conservative treatment.  He has been allowed to lose weight.   He has gotten his BMI down below 40.  It is between 37 and 38.  We had a long and thorough discussion about the difficult nature of the surgery given his spine and back fusion creating the high risk of dislocation.  Also, with his weight there is a high  risk of acute blood loss anemia, nerve or vessel injury, fracture, infection, DVT, implant failure and skin and soft tissue issues.  He knows our goals are trying to decrease his pain, improve his mobility and improve his quality of life.  DESCRIPTION OF PROCEDURE:  After informed  consent was obtained, and appropriate right hip was marked.  He was brought to the operating room and sat up on a stretcher.  Spinal anesthesia was attempted.  It was not successful so he was laid in supine  position.  General anesthesia was obtained.  Traction boots were placed on both his feet.  Next, he was placed supine on the Hana fracture table with a perineal post in place and both legs in line skeletal traction device and no traction applied.  His  right operative hip was prepped and draped with DuraPrep and sterile drapes.  A time-out was called to identify correct patient, correct right hip.  We then made an incision just inferior and posterior to the anterior superior iliac spine and carried  this obliquely down the leg.  We dissected down to the tensor fascia lata muscle and the tensor fascia was then divided longitudinally to proceed with direct anterior approach to the hip.  We identified and cauterized circumflex vessels then identified  the hip capsule, opened up the hip capsule in L-type format finding a moderate joint effusion.  We placed curved retractors within the medial and lateral joint capsule around the femoral neck and made our femoral neck cut with the oscillating saw,  proximal to the lesser trochanter.  We completed this with an osteotome.  We placed a corkscrew guide in the femoral head and removed the femoral heads in its entirety and found a wide area devoid of cartilage.  I then placed a bent Hohmann over the  medial acetabular rim and removed remnants of the acetabular labrum and  other debris.  I then began reaming under direct visualization from a size 43 reamer in stepwise increments going up to a size 53 with all reamers placed under direct visualization  and the last two reamers were placed under direct fluoroscopy, so we could obtain our depth of reaming our inclination and anteversion.  I then placed the real DePuy Sector Gription acetabular component size 54.  We did  not need to place any screws.  I  felt a nice tight and secure.  We placed a 36+4 polyethylene liner for that size acetabular component.  Attention was then turned to the femur with the leg externally rotated to 120 degrees, extended and adducted we are to place a Mueller retractor  medially and Hohman retractor behind the greater trochanter.  We released lateral joint capsule and used a box cutting osteotome to enter femoral canal and a rongeur to lateralize.  I then began broaching using the Corail broaching system from a size 8  going up to a size 16.  With a size 16 in place we trialed standard offset femoral neck and with a higher neck cut we removed a 36-2 hip ball, reduced this in acetabulum and we definitely needed more offset for sure and leg length.  We dislocated the hip  and removed the trial components.  We went with the real Corail femoral stem size 16 with a high offset stem.  We placed this down the femoral canal and had nice and tight fit.  We really try to compensate for the fact that he has a spine fusion of the  pelvis, so I opened up the socket a little bit more when I did that side and the stem is slightly externally rotated, but he lies down in a rotated position with a flexion contracture of the knee.  We were able to put the real size 16 stem and we decided  we needed to lengthen him significantly.  So we went ahead with 36+5 hip ball and try to reduce this and acetabulum, which was too tight and we could not get it reduced, so we removed that 36+5 metal hip ball with a 36+1.5 metal hip ball and with that  one we were able to reduce in the pelvis.  It was then assessed mechanically and radiographically and it was stable throughout her exam.  We then irrigated the soft tissue with normal saline solution using pulse lavage.  We were only able to close a  small remnant of the joint capsule with #1 Ethibond suture, #1 Vicryl was used to close the tensor fascia and 0 Vicryl was used to  close deep tissue, 2-0 Vicryl was used to close subcutaneous tissue and staples were placed to reapproximate the skin.  A  well-padded sterile dressing was applied.  He was taken off the Hana table, awakened, extubated, and taken to recovery room in stable condition with all final counts being correct.  No complications noted.  Of note, Rexene Edison, PA-C assisted during the  entire case and his assistance was crucial for facilitating all aspects of this case.   PUS D: 01/17/2021 2:37:05 pm T: 01/17/2021 3:29:00 pm  JOB: [id number]/ [id number]

## 2021-01-17 NOTE — H&P (Signed)
TOTAL HIP ADMISSION H&P  Patient is admitted for right total hip arthroplasty.  Subjective:  Chief Complaint: right hip pain  HPI: Jordan [name], 70 y.o. male, has a history of pain and functional disability in the right hip(s) due to arthritis and patient has failed non-surgical conservative treatments for greater than 12 weeks to include NSAID's and/or analgesics, corticosteriod injections, flexibility and strengthening excercises, use of assistive devices, weight reduction as appropriate and activity modification.  Onset of symptoms was gradual starting 2 years ago with gradually worsening course since that time.The patient noted no past surgery on the right hip(s).  Patient currently rates pain in the right hip at 10 out of 10 with activity. Patient has night pain, worsening of pain with activity and weight bearing, trendelenberg gait, pain that interfers with activities of daily living and pain with passive range of motion. Patient has evidence of subchondral sclerosis, periarticular osteophytes and joint space narrowing by imaging studies. This condition presents safety issues increasing the risk of falls.  There is no current active infection.  Patient Active Problem List   Diagnosis Date Noted  . Unilateral primary osteoarthritis, right hip 11/09/2020   Past Medical History:  Diagnosis Date  . Arthritis    Back, hand, elbows, knees, hips  . GERD (gastroesophageal reflux disease)   . Hypertension   . Myocardial infarction (HCC)   . Sleep apnea     Past Surgical History:  Procedure Laterality Date  . BACK SURGERY     Lower back fusion  . CORONARY STENT PLACEMENT  2014  . EYE SURGERY     Bilateral cataract removal    Current Facility-Administered Medications  Medication Dose Route Frequency Provider Last Rate Last Admin  . ceFAZolin (ANCEF) 3-0.9 GM/100ML-% IVPB           . ceFAZolin (ANCEF) IVPB 3g/100 mL premix  3 g Intravenous On Call to OR Kathryne Hitch, MD      .  lactated ringers infusion   Intravenous Continuous Ellender, Catheryn Bacon, MD 10 mL/hr at 01/17/21 1033 New Bag at 01/17/21 1033  . tranexamic acid (CYKLOKAPRON) 1000MG /139mL IVPB           . tranexamic acid (CYKLOKAPRON) IVPB 1,000 mg  1,000 mg Intravenous To OR 80m, PA-C       Allergies  Allergen Reactions  . Iodinated Diagnostic Agents Shortness Of Breath, Itching and Other (See Comments)    Burning sensation  . Gabapentin Hives, Rash and Other (See Comments)    Burning sensation    Social History   Tobacco Use  . Smoking status: Never Smoker  . Smokeless tobacco: Never Used  Substance Use Topics  . Alcohol use: Yes    Comment: Drinks no more than 2 cases of beer per year    History reviewed. No pertinent family history.   Review of Systems  Musculoskeletal: Positive for back pain and gait problem.  All other systems reviewed and are negative.   Objective:  Physical Exam Vitals reviewed.  Constitutional:      Appearance: Normal appearance.  HENT:     Head: Normocephalic and atraumatic.  Eyes:     Extraocular Movements: Extraocular movements intact.     Pupils: Pupils are equal, round, and reactive to light.  Cardiovascular:     Rate and Rhythm: Normal rate and regular rhythm.     Pulses: Normal pulses.  Pulmonary:     Effort: Pulmonary effort is normal.     Breath sounds: Normal  breath sounds.  Abdominal:     Palpations: Abdomen is soft.  Musculoskeletal:     Cervical back: Normal range of motion and neck supple.     Right hip: Tenderness and bony tenderness present. Decreased range of motion. Decreased strength.  Neurological:     Mental Status: He is alert and oriented to person, place, and time.  Psychiatric:        Behavior: Behavior normal.     Vital signs in last 24 hours: Temp:  [98.2 F (36.8 C)] 98.2 F (36.8 C) (06/07 0940) Pulse Rate:  [62] 62 (06/07 0940) Resp:  [17] 17 (06/07 0940) BP: (176)/(77) 176/77 (06/07 0940) SpO2:  [98 %]  98 % (06/07 0940) Weight:  [124.7 kg] 124.7 kg (06/07 0940)  Labs:   Estimated body mass index is 38.35 kg/m as calculated from the following:   Height as of this encounter: 5\' 11"  (1.803 m).   Weight as of this encounter: 124.7 kg.   Imaging Review Plain radiographs demonstrate severe degenerative joint disease of the right hip(s). The bone quality appears to be good for age and reported activity level.      Assessment/Plan:  End stage arthritis, right hip(s)  The patient history, physical examination, clinical judgement of the provider and imaging studies are consistent with end stage degenerative joint disease of the right hip(s) and total hip arthroplasty is deemed medically necessary. The treatment options including medical management, injection therapy, arthroscopy and arthroplasty were discussed at length. The risks and benefits of total hip arthroplasty were presented and reviewed. The risks due to aseptic loosening, infection, stiffness, dislocation/subluxation,  thromboembolic complications and other imponderables were discussed.  The patient acknowledged the explanation, agreed to proceed with the plan and consent was signed. Patient is being admitted for inpatient treatment for surgery, pain control, PT, OT, prophylactic antibiotics, VTE prophylaxis, progressive ambulation and ADL's and discharge planning.The patient is planning to be discharged home with home health services

## 2021-01-18 DIAGNOSIS — M1611 Unilateral primary osteoarthritis, right hip: Secondary | ICD-10-CM | POA: Diagnosis not present

## 2021-01-18 LAB — CBC
HCT: 39 % (ref 39.0–52.0)
Hemoglobin: 13.1 g/dL (ref 13.0–17.0)
MCH: 31.6 pg (ref 26.0–34.0)
MCHC: 33.6 g/dL (ref 30.0–36.0)
MCV: 94.2 fL (ref 80.0–100.0)
Platelets: 238 10*3/uL (ref 150–400)
RBC: 4.14 MIL/uL — ABNORMAL LOW (ref 4.22–5.81)
RDW: 12.8 % (ref 11.5–15.5)
WBC: 14.1 10*3/uL — ABNORMAL HIGH (ref 4.0–10.5)
nRBC: 0 % (ref 0.0–0.2)

## 2021-01-18 LAB — BASIC METABOLIC PANEL
Anion gap: 9 (ref 5–15)
BUN: 21 mg/dL (ref 8–23)
CO2: 27 mmol/L (ref 22–32)
Calcium: 9.1 mg/dL (ref 8.9–10.3)
Chloride: 99 mmol/L (ref 98–111)
Creatinine, Ser: 1.14 mg/dL (ref 0.61–1.24)
GFR, Estimated: >60 mL/min (ref >60)
Glucose, Bld: 164 mg/dL — ABNORMAL HIGH (ref 70–99)
Potassium: 4.6 mmol/L (ref 3.5–5.1)
Sodium: 135 mmol/L (ref 135–145)

## 2021-01-18 MED ORDER — OXYCODONE HCL 5 MG PO TABS: 5.0000 mg | ORAL_TABLET | ORAL | 0 refills | Status: DC | PRN

## 2021-01-18 NOTE — Discharge Instructions (Signed)

## 2021-01-18 NOTE — Progress Notes (Signed)
Patient was transported via wheelchair by volunteer for discharge home; in no acute distress nor complaints of pain nor discomfort; all belongings checked and accounted for; discharge instructions given to patient by RN and he verbalized understanding on the instructions given.

## 2021-01-18 NOTE — Evaluation (Signed)
Physical Therapy Evaluation Patient Details Name: Jordan [name] MRN: [id number] DOB: 1950/11/02 Today's Date: 01/18/2021   History of Present Illness  Admitted for R THA, s/p RTHA, direct anterior 01/17/21, WBAT;  has a past medical history of Arthritis, GERD (gastroesophageal reflux disease), Hypertension, Myocardial infarction [facility]), and Sleep apnea.  Clinical Impression   Pt is s/p THA resulting in the deficits listed below (see PT Problem List). Comes from home where he was managing independently, but in significant R hip pain; Daughter and wife can give assist at home; Presents to PT with R hip postop pain, decr activity tolerance, decr knowledge of stair negotiation; Pt will benefit from skilled PT to increase their independence and safety with mobility to allow discharge to the venue listed below.   Will plan for another session for stair training, then I anticipate he will be able to get home today    Follow Up Recommendations Follow surgeon's recommendation for DC plan and follow-up therapies;Other (comment) (Pt tells me Dr. Magnus Ivan wants HHPT follow up)    Equipment Recommendations  None recommended by PT (Well-equipped)    Recommendations for Other Services       Precautions / Restrictions Restrictions Weight Bearing Restrictions: Yes RLE Weight Bearing: Weight bearing as tolerated      Mobility  Bed Mobility Overal bed mobility: Needs Assistance Bed Mobility: Supine to Sit     Supine to sit: Min assist     General bed mobility comments: Cues for technqiue; handheld assist to pull to sit; tending to hold breath    Transfers Overall transfer level: Needs assistance Equipment used: Rolling walker (2 wheeled) Transfers: Sit to/from Stand Sit to Stand: Min guard         General transfer comment: Cues for hand placement ; good rise, slightly decr control of descent to sit  Ambulation/Gait Ambulation/Gait assistance: Min guard;Supervision Gait Distance (Feet):  250 Feet Assistive device: Rolling walker (2 wheeled) Gait Pattern/deviations: Step-through pattern Gait velocity: slowed   General Gait Details: Cues to stand tall on RLE in stance and activate gluteals and quads for stabiltiy  Stairs         General stair comments: Showed pt's daughter and wife technqiue for stairs; plan to return for stari training with pt next session  Wheelchair Mobility    Modified Rankin (Stroke Patients Only)       Balance Overall balance assessment: Mild deficits observed, not formally tested                                           Pertinent Vitals/Pain Pain Assessment: 0-10 Pain Score: 3  Pain Location: R hip Pain Descriptors / Indicators: Aching Pain Intervention(s): Monitored during session;Premedicated before session    Home Living Family/patient expects to be discharged to:: Private residence Living Arrangements: Spouse/significant other;Children Available Help at Discharge: Family;Available 24 hours/day Type of Home: Mobile home Home Access: Stairs to enter Entrance Stairs-Rails: None Entrance Stairs-Number of Steps: 4 Home Layout: One level Home Equipment: Walker - 2 wheels;Bedside commode Additional Comments: Adjustable bed    Prior Function Level of Independence: Independent               Hand Dominance        Extremity/Trunk Assessment   Upper Extremity Assessment Upper Extremity Assessment: Overall WFL for tasks assessed    Lower Extremity Assessment Lower Extremity Assessment: RLE deficits/detail RLE Deficits /  Details: Grossly decr aROM and strength, limited by pain and stiffness postop       Communication   Communication: No difficulties  Cognition Arousal/Alertness: Awake/alert Behavior During Therapy: WFL for tasks assessed/performed Overall Cognitive Status: Within Functional Limits for tasks assessed                                        General Comments  General comments (skin integrity, edema, etc.): Discussed car transfers    Exercises Total Joint Exercises Ankle Circles/Pumps: AROM;Both;10 reps Quad Sets: AROM;Right;10 reps Gluteal Sets: AROM;Both;10 reps Towel Squeeze: AROM;Both;10 reps Heel Slides: AAROM;Right;10 reps Hip ABduction/ADduction: AAROM;Right;10 reps   Assessment/Plan    PT Assessment Patient needs continued PT services  PT Problem List Decreased strength;Decreased range of motion;Decreased activity tolerance;Decreased knowledge of use of DME;Decreased knowledge of precautions;Pain       PT Treatment Interventions DME instruction;Gait training;Stair training;Functional mobility training;Therapeutic activities;Therapeutic exercise;Balance training;Patient/family education    PT Goals (Current goals can be found in the Care Plan section)  Acute Rehab PT Goals Patient Stated Goal: walk without pain PT Goal Formulation: With patient Time For Goal Achievement: 01/25/21 Potential to Achieve Goals: Good    Frequency 7X/week   Barriers to discharge        Co-evaluation               AM-PAC PT "6 Clicks" Mobility  Outcome Measure Help needed turning from your back to your side while in a flat bed without using bedrails?: None Help needed moving from lying on your back to sitting on the side of a flat bed without using bedrails?: A Little Help needed moving to and from a bed to a chair (including a wheelchair)?: A Little Help needed standing up from a chair using your arms (e.g., wheelchair or bedside chair)?: A Little Help needed to walk in hospital room?: A Little Help needed climbing 3-5 steps with a railing? : A Little 6 Click Score: 19    End of Session Equipment Utilized During Treatment: Gait belt Activity Tolerance: Patient tolerated treatment well Patient left: in chair;with call bell/phone within reach;with family/visitor present Nurse Communication: Mobility status PT Visit Diagnosis: Other  abnormalities of gait and mobility (R26.89);Pain Pain - Right/Left: Right Pain - part of body: Hip    Time: 0900-0949 PT Time Calculation (min) (ACUTE ONLY): 49 min   Charges:   PT Evaluation $PT Eval Low Complexity: 1 Low PT Treatments $Gait Training: 8-22 mins $Therapeutic Exercise: 8-22 mins        Van Clines, PT  Acute Rehabilitation Services Pager [phone] Office [phone]   Jordan [name] 01/18/2021, 10:49 AM

## 2021-01-18 NOTE — Plan of Care (Signed)
  Problem: Safety: Goal: Ability to remain free from injury will improve Outcome: Progressing   Problem: Education: Goal: Knowledge of the prescribed therapeutic regimen will improve Outcome: Progressing   Problem: Activity: Goal: Ability to avoid complications of mobility impairment will improve Outcome: Progressing Goal: Ability to tolerate increased activity will improve Outcome: Progressing

## 2021-01-18 NOTE — Anesthesia Postprocedure Evaluation (Signed)
Anesthesia Post Note  Patient: Restaurant manager, [employer]  Procedure(s) Performed: RIGHT TOTAL HIP ARTHROPLASTY ANTERIOR APPROACH (Right Hip)     Patient location during evaluation: PACU Anesthesia Type: General Level of consciousness: awake and alert Pain management: pain level controlled Vital Signs Assessment: post-procedure vital signs reviewed and stable Respiratory status: spontaneous breathing, nonlabored ventilation, respiratory function stable and patient connected to nasal cannula oxygen Cardiovascular status: blood pressure returned to baseline and stable Postop Assessment: no apparent nausea or vomiting Anesthetic complications: no   No complications documented.  Last Vitals:  Vitals:   01/18/21 0344 01/18/21 0740  BP: 140/79 121/75  Pulse: 66 63  Resp: 20 18  Temp: 36.7 C 37 C  SpO2: 100% 95%              Shelton Silvas

## 2021-01-18 NOTE — Discharge Summary (Signed)
Patient ID: Jordan [name] MRN: [id number] DOB/AGE: Jul 27, 1951 70 y.o.  Admit date: 01/17/2021 Discharge date: 01/18/2021  Admission Diagnoses:  Principal Problem:   Unilateral primary osteoarthritis, right hip Active Problems:   Status post total replacement of right hip   Discharge Diagnoses:  Same  Past Medical History:  Diagnosis Date  . Arthritis    Back, hand, elbows, knees, hips  . GERD (gastroesophageal reflux disease)   . Hypertension   . Myocardial infarction (HCC)   . Sleep apnea     Surgeries: Procedure(s): RIGHT TOTAL HIP ARTHROPLASTY ANTERIOR APPROACH on 01/17/2021   Consultants:   Discharged Condition: Improved  Hospital Course: Jordan [name] is an 70 y.o. male who was admitted 01/17/2021 for operative treatment ofUnilateral primary osteoarthritis, right hip. Patient has severe unremitting pain that affects sleep, daily activities, and work/hobbies. After pre-op clearance the patient was taken to the operating room on 01/17/2021 and underwent  Procedure(s): RIGHT TOTAL HIP ARTHROPLASTY ANTERIOR APPROACH.    Patient was given perioperative antibiotics:  Anti-infectives (From admission, onward)   Start     Dose/Rate Route Frequency Ordered Stop   01/17/21 1815  ceFAZolin (ANCEF) IVPB 2g/100 mL premix        2 g 200 mL/hr over 30 Minutes Intravenous Every 6 hours 01/17/21 1719 01/17/21 2354   01/17/21 1815  fluconazole (DIFLUCAN) tablet 150 mg        150 mg Oral  Once 01/17/21 1718 01/17/21 2222   01/17/21 1015  ceFAZolin (ANCEF) IVPB 3g/100 mL premix        3 g 200 mL/hr over 30 Minutes Intravenous On call to O.R. 01/17/21 1008 01/17/21 1244   01/17/21 1010  ceFAZolin (ANCEF) 3-0.9 GM/100ML-% IVPB       Note to Pharmacy: Gleason, Ginger   : cabinet override      01/17/21 1010 01/17/21 1302       Patient was given sequential compression devices, early ambulation, and chemoprophylaxis to prevent DVT.  Patient benefited maximally from hospital stay and there were no  complications.    Recent vital signs:  Patient Vitals for the past 24 hrs:  BP Temp Temp src Pulse Resp SpO2  01/18/21 0740 121/75 98.6 F (37 C) Oral 63 18 95 %  01/18/21 0344 140/79 98.1 F (36.7 C) Oral 66 20 100 %  01/18/21 0015 (!) 141/79 97.9 F (36.6 C) Oral 72 18 100 %  01/17/21 2148 (!) 143/77 98 F (36.7 C) Oral 73 18 98 %     Recent laboratory studies:  Recent Labs    01/16/21 0933 01/18/21 0140  WBC 6.6 14.1*  HGB 14.2 13.1  HCT 43.8 39.0  PLT 243 238  NA 138 135  K 3.5 4.6  CL 105 99  CO2 24 27  BUN 21 21  CREATININE 1.00 1.14  GLUCOSE 131* 164*  CALCIUM 9.2 9.1     Discharge Medications:   Allergies as of 01/18/2021      Reactions   Iodinated Diagnostic Agents Shortness Of Breath, Itching, Other (See Comments)   Burning sensation   Gabapentin Hives, Rash, Other (See Comments)   Burning sensation      Medication List    TAKE these medications   amLODipine 10 MG tablet Commonly known as: NORVASC Take 10 mg by mouth daily.   aspirin 81 MG chewable tablet Chew 81 mg by mouth daily.   clopidogrel 75 MG tablet Commonly known as: PLAVIX Take 75 mg by mouth daily.   clotrimazole-betamethasone cream Commonly  known as: LOTRISONE Apply 1 application topically daily.   cyclobenzaprine 10 MG tablet Commonly known as: FLEXERIL Take 10 mg by mouth 2 (two) times daily as needed for muscle spasms.   furosemide 20 MG tablet Commonly known as: LASIX Take 20 mg by mouth daily.   HYDROcodone-ibuprofen 7.5-200 MG tablet Commonly known as: VICOPROFEN Take 1 tablet by mouth 2 (two) times daily as needed for pain.   losartan 100 MG tablet Commonly known as: COZAAR Take 100 mg by mouth daily.   metoprolol succinate 50 MG 24 hr tablet Commonly known as: TOPROL-XL Take 50 mg by mouth daily. Take with or immediately following a meal.   naproxen 500 MG tablet Commonly known as: NAPROSYN Take 500 mg by mouth daily as needed for moderate pain.    omeprazole 20 MG capsule Commonly known as: PRILOSEC Take 20 mg by mouth 2 (two) times daily before a meal.   oxyCODONE 5 MG immediate release tablet Commonly known as: Oxy IR/ROXICODONE Take 1-2 tablets (5-10 mg total) by mouth every 4 (four) hours as needed for moderate pain (pain score 4-6).   pravastatin 20 MG tablet Commonly known as: PRAVACHOL Take 20 mg by mouth daily.   Sennosides 25 MG Tabs Take 25 mg by mouth daily.   tamsulosin 0.4 MG Caps capsule Commonly known as: FLOMAX Take 0.4 mg by mouth daily after breakfast.            Durable Medical Equipment  (From admission, onward)         Start     Ordered   01/17/21 1720  DME 3 n 1  Once        01/17/21 1719   01/17/21 1720  DME Walker rolling  Once       Question Answer Comment  Walker: With 5 Inch Wheels   Patient needs a walker to treat with the following condition Status post total replacement of right hip      01/17/21 1719          Diagnostic Studies: DG Pelvis Portable  Result Date: 01/17/2021 CLINICAL DATA:  Status post right hip replacement. EXAM: PORTABLE PELVIS 1-2 VIEWS COMPARISON:  11/09/2020 FINDINGS: Right hip arthroplasty in expected alignment. No periprosthetic lucency or fracture. Recent postsurgical change includes air and edema in the joint space and soft tissues. Lateral skin staples. IMPRESSION: Right hip arthroplasty without immediate postoperative complication. Electronically Signed   By: Narda Rutherford M.D.   On: 01/17/2021 15:26   DG C-Arm 1-60 Min  Result Date: 01/17/2021 CLINICAL DATA:  Right hip arthroplasty. EXAM: OPERATIVE RIGHT HIP (WITH PELVIS IF PERFORMED) TECHNIQUE: Fluoroscopic spot image(s) were submitted for interpretation post-operatively. COMPARISON:  Preoperative radiograph 10/30/2020 FINDINGS: Three fluoroscopic spot views of the pelvis and right hip obtained in the operating room. Right hip arthroplasty in expected alignment. Total fluoroscopy time 51.4 seconds.  Dose 12.13 mGy. IMPRESSION: Procedural fluoroscopy for right hip arthroplasty. Electronically Signed   By: Narda Rutherford M.D.   On: 01/17/2021 15:09   DG HIP OPERATIVE UNILAT WITH PELVIS RIGHT  Result Date: 01/17/2021 CLINICAL DATA:  Right hip arthroplasty. EXAM: OPERATIVE RIGHT HIP (WITH PELVIS IF PERFORMED) TECHNIQUE: Fluoroscopic spot image(s) were submitted for interpretation post-operatively. COMPARISON:  Preoperative radiograph 10/30/2020 FINDINGS: Three fluoroscopic spot views of the pelvis and right hip obtained in the operating room. Right hip arthroplasty in expected alignment. Total fluoroscopy time 51.4 seconds. Dose 12.13 mGy. IMPRESSION: Procedural fluoroscopy for right hip arthroplasty. Electronically Signed   By: Shawna Orleans  Sanford M.D.   On: 01/17/2021 15:09    Disposition: Discharge disposition: 01-Home or Self Care          Follow-up Information    Kathryne Hitch, MD Follow up in 2 week(s).   Specialty: Orthopedic Surgery Contact information: [address] [place] [address] [phone]        [facility] Follow up.   Specialty: Home Health Services Why: Your home health has been set up with [facility]. The office will call you with start of care information. For any questions please call number listed above.  Contact information: [address] [place] [address] [phone]                Signed: Kathryne Hitch 01/18/2021, 5:37 PM

## 2021-01-18 NOTE — TOC Progression Note (Signed)
Transition of Care [facility]) - Progression Note    Patient Details  Name: Jordan [name] MRN: [id number] Date of Birth: 10-04-1950  Transition of Care [facility]) CM/SW Contact  Beckie Busing, RN Phone Number: [phone]  01/18/2021, 12:54 PM  Clinical Narrative:    Cm at bedside to set up [facility] PT. [facility] PT has been set up prior to admission  With [facility]. No other needs noted. TOC will sign off.         Expected Discharge Plan and Services                                       HH Agency: [facility] Date [facility] Agency Contacted: 01/18/21 Time HH Agency Contacted: [id number] Representative spoke with at [facility] [place] Agency: Stacie (set up prior to surgery)   Social Determinants of Health (SDOH) Interventions    Readmission Risk Interventions No flowsheet data found.

## 2021-01-18 NOTE — Progress Notes (Signed)
Physical Therapy Treatment Patient Details Name: Jordan [name] MRN: [id number] DOB: Dec 12, 1950 Today's Date: 01/18/2021    History of Present Illness Admitted for R THA, s/p RTHA, direct anterior 01/17/21, WBAT;  has a past medical history of Arthritis, GERD (gastroesophageal reflux disease), Hypertension, Myocardial infarction [facility]), and Sleep apnea.    PT Comments    Continuing work on functional mobility and activity tolerance;  Session focused on stair training, and pt and daughter performed well; Questions solicited and answered; OK for dc home from PT standpoint     Follow Up Recommendations  Follow surgeon's recommendation for DC plan and follow-up therapies;Other (comment) (Pt tells me Dr. Magnus Ivan wants HHPT follow up)     Equipment Recommendations  None recommended by PT (Well-equipped)    Recommendations for Other Services       Precautions / Restrictions Restrictions RLE Weight Bearing: Weight bearing as tolerated    Mobility  Bed Mobility Overal bed mobility: Needs Assistance Bed Mobility: Supine to Sit;Sit to Supine     Supine to sit: Min assist Sit to supine: Min assist   General bed mobility comments: Cues for technqiue; handheld assist to pull to sit; tending to hold breath; min assist to helpRLE into bed    Transfers Overall transfer level: Needs assistance Equipment used: Rolling walker (2 wheeled) Transfers: Sit to/from Stand Sit to Stand: Min guard         General transfer comment: Cues for hand placement ; good rise, slightly decr control of descent to sit  Ambulation/Gait Ambulation/Gait assistance: Min guard;Supervision Gait Distance (Feet): 250 Feet Assistive device: Rolling walker (2 wheeled) Gait Pattern/deviations: Step-through pattern Gait velocity: slowed   General Gait Details: Cues to stand tall on RLE in stance and activate gluteals and quads for stabiltiy   Stairs Stairs: Yes Stairs assistance: Min assist Stair Management: No  rails;Backwards;With walker Number of Stairs: 4 General stair comments: Pts daughter able to give correct step-by-step instructions to pt; overall managing steps well, and pt an ddaughter voiced confidence in ability to manage stairs   Wheelchair Mobility    Modified Rankin (Stroke Patients Only)       Balance Overall balance assessment: Mild deficits observed, not formally tested                                          Cognition Arousal/Alertness: Awake/alert Behavior During Therapy: WFL for tasks assessed/performed Overall Cognitive Status: Within Functional Limits for tasks assessed                                        Exercises      General Comments General comments (skin integrity, edema, etc.): Discussed car transfers      Pertinent Vitals/Pain Pain Assessment: 0-10 Pain Score: 5  Pain Location: R hip Pain Descriptors / Indicators: Aching Pain Intervention(s): Monitored during session    Home Living                      Prior Function            PT Goals (current goals can now be found in the care plan section) Acute Rehab PT Goals Patient Stated Goal: walk without pain PT Goal Formulation: With patient Time For Goal Achievement: 01/25/21 Potential to Achieve Goals: Good  Progress towards PT goals: Progressing toward goals    Frequency    7X/week      PT Plan Current plan remains appropriate    Co-evaluation              AM-PAC PT "6 Clicks" Mobility   Outcome Measure  Help needed turning from your back to your side while in a flat bed without using bedrails?: None Help needed moving from lying on your back to sitting on the side of a flat bed without using bedrails?: A Little Help needed moving to and from a bed to a chair (including a wheelchair)?: A Little Help needed standing up from a chair using your arms (e.g., wheelchair or bedside chair)?: A Little Help needed to walk in hospital  room?: A Little Help needed climbing 3-5 steps with a railing? : A Little 6 Click Score: 19    End of Session Equipment Utilized During Treatment: Gait belt Activity Tolerance: Patient tolerated treatment well Patient left: in bed;with call bell/phone within reach;with family/visitor present Nurse Communication: Mobility status PT Visit Diagnosis: Other abnormalities of gait and mobility (R26.89);Pain Pain - Right/Left: Right Pain - part of body: Hip     Time: 1346-1406 PT Time Calculation (min) (ACUTE ONLY): 20 min  Charges:  $Gait Training: 8-22 mins                     Van Clines, PT  Acute Rehabilitation Services Pager [phone] Office [phone]    Levi Aland 01/18/2021, 2:16 PM

## 2021-01-19 ENCOUNTER — Encounter (HOSPITAL_COMMUNITY): Payer: Self-pay | Admitting: Orthopaedic Surgery

## 2021-01-23 ENCOUNTER — Other Ambulatory Visit: Payer: Self-pay | Admitting: Orthopaedic Surgery

## 2021-01-23 ENCOUNTER — Telehealth: Payer: Self-pay

## 2021-01-23 MED ORDER — SULFAMETHOXAZOLE-TRIMETHOPRIM 800-160 MG PO TABS: 1.0000 | ORAL_TABLET | Freq: Two times a day (BID) | ORAL | 0 refills | Status: DC

## 2021-01-23 NOTE — Telephone Encounter (Signed)
Myra, HHN left a VM stating that patient is showing signs of infection at his incision on his right hip.  Stated that patient has inflammation around his staples with yellow discharge, a little blood, and redness. Right THA on 01/17/2021.  CB# for [facility] [phone], CB# for patient [phone].  Please advise.  Thank you.

## 2021-01-23 NOTE — Telephone Encounter (Signed)
I called pt. I scheduled him for Wednesday morning. Pt understands to start the antibiotic and and keep incision clean and dry

## 2021-01-25 ENCOUNTER — Ambulatory Visit: Payer: Medicare Other | Admitting: Orthopaedic Surgery

## 2021-01-25 ENCOUNTER — Ambulatory Visit (INDEPENDENT_AMBULATORY_CARE_PROVIDER_SITE_OTHER): Payer: Medicare Other | Admitting: Orthopaedic Surgery

## 2021-01-25 ENCOUNTER — Encounter: Payer: Self-pay | Admitting: Orthopaedic Surgery

## 2021-01-25 ENCOUNTER — Other Ambulatory Visit: Payer: Self-pay

## 2021-01-25 DIAGNOSIS — Z96641 Presence of right artificial hip joint: Secondary | ICD-10-CM

## 2021-01-25 MED ORDER — OXYCODONE HCL 5 MG PO TABS: 5.0000 mg | ORAL_TABLET | ORAL | 0 refills | Status: DC | PRN

## 2021-01-25 NOTE — Progress Notes (Signed)
HPI: Jordan [name] returns today status post right total hip arthroplasty 01/17/2021.  Comes in today due to drainage questionable reaction.  He has had temperature around 99 F.  He is ambulating with a walker.  He states overall he is doing well this feels tight.  He is on chronic Plavix and aspirin.  No shortness of breath or chest pain.  He is on Bactrim DS which was started this past Monday.  Physical exam: Right hip surgical incision is well approximated with slight dehiscence at the proximal end of the incision.  No expressible drainage.  Significant ecchymosis about the incision slight erythema.  Positive edema plus minus seroma.  Right calf supple nontender.  Slight pitting edema right lower leg.  Impression: Status post right total hip arthroplasty 01/17/2021  Plan: Right hip aspiration seroma 50 cc performed patient tolerates well.  Staples were left in place.  He will begin applying small amount of Bactroban ointment to the proximal incision.  Discontinue alcohol and Betadine which has been being applied to the incision area.  Begin wearing compression hose during the day particularly right leg.  Follow-up with [place] next Tuesday sooner if there is any questions concerns.  Discussed with his wife he needs to keep the proximal incision dry and covered with 4 x 4 gauze and tape.  He is able to wash the incision with an antibacterial soap daily.

## 2021-01-27 ENCOUNTER — Telehealth: Payer: Self-pay

## 2021-01-27 ENCOUNTER — Other Ambulatory Visit: Payer: Self-pay | Admitting: Orthopaedic Surgery

## 2021-01-27 MED ORDER — CEPHALEXIN 500 MG PO CAPS: 500.0000 mg | ORAL_CAPSULE | Freq: Two times a day (BID) | ORAL | 0 refills | Status: DC

## 2021-01-27 NOTE — Telephone Encounter (Signed)
Please advise 

## 2021-01-27 NOTE — Telephone Encounter (Signed)
Pt called and advised and stated understanding  

## 2021-01-27 NOTE — Telephone Encounter (Signed)
Pt called stating that the antibiotics that he was giving has broke him out in hives and he is itching all over. He took some benadryl but didn't know if something else needed to be called in or could he wait till his appt Tuesday.

## 2021-01-31 ENCOUNTER — Ambulatory Visit (INDEPENDENT_AMBULATORY_CARE_PROVIDER_SITE_OTHER): Payer: Medicare Other | Admitting: Orthopaedic Surgery

## 2021-01-31 ENCOUNTER — Encounter: Payer: Self-pay | Admitting: Orthopaedic Surgery

## 2021-01-31 DIAGNOSIS — Z96641 Presence of right artificial hip joint: Secondary | ICD-10-CM

## 2021-01-31 MED ORDER — DOXYCYCLINE HYCLATE 100 MG PO TABS: 100.0000 mg | ORAL_TABLET | Freq: Two times a day (BID) | ORAL | 0 refills | Status: DC

## 2021-01-31 NOTE — Progress Notes (Signed)
The patient is now 2 weeks today status post a right total hip arthroplasty.  He has had a little bit of breakdown of the top of his incision but this can be treated with just Bactroban.  They will put her on a once a day.  The staples have been removed and Steri-Strips applied.  We did see him last week and drained about 50 cc of the seroma off his hip.  He is on aspirin and Plavix which he was on before surgery.  He is now on Keflex because the Bactrim double strength because of the breakout.  Examination of his right hip incision shows the incisions healing nicely.  He has Steri-Strips in place.  There is a small open area at the very top of the incision superficial and just needs Bactroban ointment daily.  I did try to drain more stromal facet but is more of hematoma at this standpoint.  There is a slight red hue suggesting more of a hematoma than infection but I would like to still switching from Keflex to doxycycline at this point and see him back in 1 week just for looking at his incision.  He is not on narcotics and has not having fever and chills and no significant pain.  He can drop from my standpoint.  We will reevaluate him in 1 week just to look at the incision.  All questions and concerns were answered and addressed.

## 2021-02-07 ENCOUNTER — Encounter: Payer: Self-pay | Admitting: Orthopaedic Surgery

## 2021-02-07 ENCOUNTER — Ambulatory Visit (INDEPENDENT_AMBULATORY_CARE_PROVIDER_SITE_OTHER): Payer: Medicare Other | Admitting: Orthopaedic Surgery

## 2021-02-07 DIAGNOSIS — Z96641 Presence of right artificial hip joint: Secondary | ICD-10-CM

## 2021-02-07 NOTE — Progress Notes (Signed)
The patient is now 3 weeks status post a right total hip arthroplasty.  I have been watching incision closely due to hematoma and the fact that he is on Plavix.  At his first visit we did drain a seroma from the hip but the last visit in today's visit I could not get any fluid off of the hip.  There is still firmness around the incision.  The redness is dissipating and it looks good overall.  He has been having issues with his right knee which may need replacing at some point.  He will continue to watch his incision daily and clean it with antibacterial soapy water.  I would like to see him back in 2 weeks for a recheck of his right hip incision.  If there are issues before then they will let us know.

## 2021-02-21 ENCOUNTER — Ambulatory Visit (INDEPENDENT_AMBULATORY_CARE_PROVIDER_SITE_OTHER): Payer: Medicare Other | Admitting: Orthopaedic Surgery

## 2021-02-21 ENCOUNTER — Encounter: Payer: Self-pay | Admitting: Orthopaedic Surgery

## 2021-02-21 DIAGNOSIS — Z96641 Presence of right artificial hip joint: Secondary | ICD-10-CM

## 2021-02-21 NOTE — Progress Notes (Signed)
HPI: Mr. Thomann returns today for wound check right total hip arthroplasty which was performed on 01/17/2021.  He denies any fevers chills.  Denies any drainage.  He is washing the wound with an antibacterial soap and applying Bactroban ointment.  He has no concerns.  He is asking about getting [place] in the [place].  Physical exam: Right hip good range of motion without pain.  Right calf supple nontender.  Dorsiflexion plantarflexion ankle intact.  Right hip surgical incisions healing well.  Proximal incision there is a small punctate area that is slightly inflamed.  But otherwise no signs of infection.  No drainage.  Impression: Status post right total hip arthroplasty 01/17/2021  Plan: He will continue to wash the area with antibacterial soap and apply a small amount of Bactroban ointment.  He is encouraged to keep the proximal incision clean and dry.  Scar tissue mobilization encouraged.  Like for him to stay out of any bodies of water until the small punctate wound is completely healed.  See him back in just 3 weeks see evidence of doing overall.  Questions encouraged and answered at length.  Follow-up with [place] sooner if there is any concerns.

## 2021-03-15 ENCOUNTER — Ambulatory Visit (INDEPENDENT_AMBULATORY_CARE_PROVIDER_SITE_OTHER): Payer: Medicare Other

## 2021-03-15 ENCOUNTER — Ambulatory Visit (INDEPENDENT_AMBULATORY_CARE_PROVIDER_SITE_OTHER): Payer: Medicare Other | Admitting: Physician Assistant

## 2021-03-15 ENCOUNTER — Encounter: Payer: Self-pay | Admitting: Physician Assistant

## 2021-03-15 DIAGNOSIS — G8929 Other chronic pain: Secondary | ICD-10-CM

## 2021-03-15 DIAGNOSIS — Z96641 Presence of right artificial hip joint: Secondary | ICD-10-CM

## 2021-03-15 DIAGNOSIS — M25561 Pain in right knee: Secondary | ICD-10-CM | POA: Diagnosis not present

## 2021-03-15 NOTE — Progress Notes (Signed)
Office Visit Note   Patient: Jordan [name]           Date of Birth: November 06, 1950           MRN: [id number] Visit Date: 03/15/2021              Requested by: Stefanie Libel, MD [address] [place] ,  [place] [address] PCP: Stefanie Libel, MD   Assessment & Plan: Visit Diagnoses:  1. Chronic pain of right knee   2. Status post total replacement of right hip     Plan: We will see him back in 4 weeks to see how he is doing overall in regards to the right total hip arthroplasty.  Regards to his knee since he is on Plavix advised for him to avoid oral NSAIDs and the discussed with him at length about this.  If he needs to use an NSAID I rather use topical NSAIDs such as diclofenac on the knee.  He has failed conservative treatment in regards to the right knee which is included time and injections with cortisone.  He is interested in discussing right total knee arthroplasty at next visit.  I did review his radiographs of his right knee which shows basically bone-on-bone medial compartment with tricompartmental changes.  He will work on quad strengthening in the interim.  We will work on setting him up for knee replacement in his next office visit.  Follow-Up Instructions: Return in about 4 weeks (around 04/12/2021).   Orders:  Orders Placed This Encounter  Procedures   XR Knee 1-2 Views Right   No orders of the defined types were placed in this encounter.     Procedures: No procedures performed   Clinical Data: No additional findings.   Subjective: Chief Complaint  Patient presents with   Right Hip - Wound Check    HPI Mr. [name] returns today status post right total hip arthroplasty 01/17/2021.  He states he is overall trending towards improvement.  This still have some numbness down the lateral aspect of the right thigh.  He is having increased knee pain.  He would like to discuss right total knee arthroplasty.  He has had cortisone injections in the past that only gave him relief for  about a week.  He has had no new injury to the right knee.  Review of Systems See HPI otherwise negative  Objective: Vital Signs: There were no vitals taken for this visit.  Physical Exam General: Well-developed well-nourished male no acute distress mood affect appropriate.  Ambulates without any assistive device. Ortho Exam Right hip surgical incisions healing well no signs of infection or or wound breakdown.  He has excellent range of motion of the right hip without pain.  Right knee has tenderness along the medial joint line.  No abnormal warmth erythema.  Slight effusion.  No instability valgus varus stressing.  Right calf supple nontender.  He has tenderness about the anterior proximal tibial shaft. Specialty Comments:  No specialty comments available.  Imaging: XR Knee 1-2 Views Right  Result Date: 03/15/2021 Right knee 2 views: Knee is well located.  No acute fractures.  Bone-on-bone medial compartment.  Moderate patellofemoral changes.  Moderate narrowing of the lateral compartment.  Normal bone density.    PMFS History: Patient Active Problem List   Diagnosis Date Noted   Status post total replacement of right hip 01/17/2021   Unilateral primary osteoarthritis, right hip 11/09/2020   Past Medical History:  Diagnosis Date   Arthritis  Back, hand, elbows, knees, hips   GERD (gastroesophageal reflux disease)    Hypertension    Myocardial infarction [facility])    Sleep apnea     History reviewed. No pertinent family history.  Past Surgical History:  Procedure Laterality Date   BACK SURGERY     Lower back fusion   CORONARY STENT PLACEMENT  2014   EYE SURGERY     Bilateral cataract removal   TOTAL HIP ARTHROPLASTY Right 01/17/2021   Procedure: RIGHT TOTAL HIP ARTHROPLASTY ANTERIOR APPROACH;  Surgeon: Kathryne Hitch, MD;  Location: [facility] OR;  Service: Orthopedics;  Laterality: Right;  Spinal was attempted with patients consent.  Patient has spinal stimulator and unable  to get access.   Social History   Occupational History   Not on file  Tobacco Use   Smoking status: Never   Smokeless tobacco: Never  Vaping Use   Vaping Use: Never used  Substance and Sexual Activity   Alcohol use: Yes    Comment: Drinks no more than 2 cases of beer per year   Drug use: Never   Sexual activity: Not on file

## 2021-04-12 ENCOUNTER — Other Ambulatory Visit: Payer: Self-pay

## 2021-04-12 ENCOUNTER — Encounter: Payer: Self-pay | Admitting: Orthopaedic Surgery

## 2021-04-12 ENCOUNTER — Ambulatory Visit (INDEPENDENT_AMBULATORY_CARE_PROVIDER_SITE_OTHER): Payer: Medicare Other | Admitting: Orthopaedic Surgery

## 2021-04-12 DIAGNOSIS — G8929 Other chronic pain: Secondary | ICD-10-CM

## 2021-04-12 DIAGNOSIS — M25561 Pain in right knee: Secondary | ICD-10-CM | POA: Diagnosis not present

## 2021-04-12 DIAGNOSIS — M1711 Unilateral primary osteoarthritis, right knee: Secondary | ICD-10-CM | POA: Diagnosis not present

## 2021-04-12 NOTE — Progress Notes (Signed)
Office Visit Note   Patient: Jordan [name]           Date of Birth: [birth year]           MRN: [id number] Visit Date: 04/12/2021              Requested by: Stefanie Libel, MD [address] [place] ,  [place] [address] PCP: Stefanie Libel, MD   Assessment & Plan: Visit Diagnoses:  1. Unilateral primary osteoarthritis, right knee   2. Chronic pain of right knee     Plan: I did show him a knee replacement model and talked about the surgery in detail.  I described the interoperative and postoperative course and what to expect with the risks and benefits of surgery.  All questions and concerns were answered and addressed.  We will work on getting this scheduled for late October.  Follow-Up Instructions: Return for 2 weeks post-op.   Orders:  No orders of the defined types were placed in this encounter.  No orders of the defined types were placed in this encounter.     Procedures: No procedures performed   Clinical Data: No additional findings.   Subjective: Chief Complaint  Patient presents with   Right Knee - Follow-up   Right Hip - Follow-up  The patient is well-known to me.  We replaced his right hip in June of this year.  That was early June.  He has known well-documented end-stage arthritis of his right knee at this point is ready to proceed with knee replacement surgery since he is recovering so well from his right hip.  He is hoping to have this done in late October of this year.  His right knee is definitely affecting his mobility, his quality of life and his uses a living.  It hurts quite a bit when he is not sitting for long period time and does wake him up at night.  It stays very stiff and swollen.  We have seen him for this knee before and he has had conservative treatment with activity modification, weight loss, quad strengthening exercises, anti-inflammatories and steroid injections.  His x-rays show tricompartment arthritis of that knee.  The right hip is doing very well  for him.  He walks without an assistive device at this standpoint except for when he needs it for his right knee.  HPI  Review of Systems There is currently listed no headache, chest pain, shortness of breath, fever, chills, nausea, vomiting  Objective: Vital Signs: There were no vitals taken for this visit.  Physical Exam He is alert and oriented x3 and in no acute distress Ortho Exam Examination of his right hip shows it moves smoothly and fluidly.  Examination of his right knee shows a mild to moderate effusion with varus malalignment.  He lacks full extension by few degrees and has good flexion but global tenderness at the medial lateral compartments and patellofemoral joint. Specialty Comments:  No specialty comments available.  Imaging: No results found. Previous x-rays of the right knee show tricompartment arthritis with varus malalignment, significant medial joint space narrowing and osteophytes in all 3 compartments as well as patellofemoral narrowing.  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 No pertinent family history.  Past Surgical History:  Procedure Laterality Date   BACK SURGERY     Lower back fusion   CORONARY STENT PLACEMENT  2014   EYE SURGERY     Bilateral cataract removal   TOTAL HIP ARTHROPLASTY Right 01/17/2021   Procedure: RIGHT TOTAL HIP ARTHROPLASTY ANTERIOR APPROACH;  Surgeon: Kathryne Hitch, MD;  Location: [facility] OR;  Service: Orthopedics;  Laterality: Right;  Spinal was attempted with patients consent.  Patient has spinal stimulator and unable to get access.   Social History   Occupational  History   Not on file  Tobacco Use   Smoking status: Never   Smokeless tobacco: Never  Vaping Use   Vaping Use: Never used  Substance and Sexual Activity   Alcohol use: Yes    Comment: Drinks no more than 2 cases of beer per year   Drug use: Never   Sexual activity: Not on file

## 2021-05-08 ENCOUNTER — Other Ambulatory Visit: Payer: Self-pay

## 2021-06-05 ENCOUNTER — Other Ambulatory Visit: Payer: Self-pay | Admitting: Physician Assistant

## 2021-06-05 DIAGNOSIS — M1711 Unilateral primary osteoarthritis, right knee: Secondary | ICD-10-CM

## 2021-06-09 NOTE — Pre-Procedure Instructions (Signed)
Surgical Instructions    Your procedure is scheduled on Thursday, June 15, 2021 at 7:30 AM.  Report to [facility] Main Entrance "A" at 5:30 A.M., then check in with the Admitting office.  Call this number if you have problems the morning of surgery:  [phone]   If you have any questions prior to your surgery date call [phone]: Open Monday-Friday 8am-4pm    Remember:  Do not eat after midnight the night before your surgery  You may drink clear liquids until 4:30 AM the morning of your surgery.   Clear liquids allowed are: Water, Non-Citrus Juices (without pulp), Carbonated Beverages, Clear Tea, Black Coffee Only, and Gatorade   Enhanced Recovery after Surgery for Orthopedics Enhanced Recovery after Surgery is a protocol used to improve the stress on your body and your recovery after surgery.  Patient Instructions  The day of surgery (if you do NOT have diabetes):  Drink ONE (1) Pre-Surgery Clear Ensure by 4:30 AM the morning of surgery   This drink was given to you during your hospital  pre-op appointment visit. Nothing else to drink after completing the  Pre-Surgery Clear Ensure.    Take these medicines the morning of surgery with A SIP OF WATER:  amLODipine (NORVASC) doxycycline (VIBRA-TABS) metoprolol succinate (TOPROL-XL) omeprazole (PRILOSEC) pravastatin (PRAVACHOL) tamsulosin (FLOMAX)  IF NEEDED: cyclobenzaprine (FLEXERIL) phenylephrine (NEO-SYNEPHRINE)  Follow your surgeon's instructions on when to stop Aspirin and clopidogrel (PLAVIX).  If no instructions were given by your surgeon then you will need to call the office to get those instructions.     As of today, STOP taking any Aleve, Naproxen, Ibuprofen, Motrin, Advil, Goody's, BC's, all herbal medications, fish oil, and all vitamins.                     Do NOT Smoke (Tobacco/Vaping) or drink Alcohol 24 hours prior to your procedure.  If you use a CPAP at night, you may bring all equipment for  your overnight stay.   Contacts, glasses, piercing's, hearing aid's, dentures or partials may not be worn into surgery, please bring cases for these belongings.    For patients admitted to the hospital, discharge time will be determined by your treatment team.   Patients discharged the day of surgery will not be allowed to drive home, and someone needs to stay with them for 24 hours.  NO VISITORS WILL BE ALLOWED IN PRE-OP WHERE PATIENTS GET READY FOR SURGERY.  ONLY 1 SUPPORT PERSON MAY BE PRESENT IN THE WAITING ROOM WHILE YOU ARE IN SURGERY.  IF YOU ARE TO BE ADMITTED, ONCE YOU ARE IN YOUR ROOM YOU WILL BE ALLOWED TWO (2) VISITORS.  Minor children may have two parents present. Special consideration for safety and communication needs will be reviewed on a case by case basis.   Special instructions:   [facility]- Preparing For Surgery  Before surgery, you can play an important role. Because skin is not sterile, your skin needs to be as free of germs as possible. You can reduce the number of germs on your skin by washing with CHG (chlorahexidine gluconate) Soap before surgery.  CHG is an antiseptic cleaner which kills germs and bonds with the skin to continue killing germs even after washing.    Oral Hygiene is also important to reduce your risk of infection.  Remember - BRUSH YOUR TEETH THE MORNING OF SURGERY WITH YOUR REGULAR TOOTHPASTE  Please do not use if you have an allergy to CHG or  antibacterial soaps. If your skin becomes reddened/irritated stop using the CHG.  Do not shave (including legs and underarms) for at least 48 hours prior to first CHG shower. It is OK to shave your face.  Please follow these instructions carefully.   Shower the NIGHT BEFORE SURGERY and the MORNING OF SURGERY  If you chose to wash your hair, wash your hair first as usual with your normal shampoo.  After you shampoo, rinse your hair and body thoroughly to remove the shampoo.  Use CHG Soap as you would any  other liquid soap. You can apply CHG directly to the skin and wash gently with a scrungie or a clean washcloth.   Apply the CHG Soap to your body ONLY FROM THE NECK DOWN.  Do not use on open wounds or open sores. Avoid contact with your eyes, ears, mouth and genitals (private parts). Wash Face and genitals (private parts)  with your normal soap.   Wash thoroughly, paying special attention to the area where your surgery will be performed.  Thoroughly rinse your body with warm water from the neck down.  DO NOT shower/wash with your normal soap after using and rinsing off the CHG Soap.  Pat yourself dry with a CLEAN TOWEL.  Wear CLEAN PAJAMAS to bed the night before surgery  Place CLEAN SHEETS on your bed the night before your surgery  DO NOT SLEEP WITH PETS.   Day of Surgery: Shower with CHG soap. Do not wear jewelry. Do not wear lotions, powders, colognes, or deodorant. Men may shave face and neck. Do not bring valuables to the hospital. [facility] is not responsible for any belongings or valuables. Wear Clean/Comfortable clothing the morning of surgery Remember to brush your teeth WITH YOUR REGULAR TOOTHPASTE.   Please read over the following fact sheets that you were given.   3 days prior to your procedure or After your COVID test   You are not required to quarantine however you are required to wear a well-fitting mask when you are out and around people not in your household. If your mask becomes wet or soiled, replace with a new one.   Wash your hands often with soap and water for 20 seconds or clean your hands with an alcohol-based hand sanitizer that contains at least 60% alcohol.   Do not share personal items.   Notify your provider:  o if you are in close contact with someone who has COVID  o or if you develop a fever of 100.4 or greater, sneezing, cough, sore throat, shortness of breath or body aches.

## 2021-06-12 ENCOUNTER — Encounter (HOSPITAL_COMMUNITY)
Admission: RE | Admit: 2021-06-12 | Discharge: 2021-06-12 | Disposition: A | Discharge status: Home / Self Care | Payer: Medicare Other | Source: Ambulatory Visit | Attending: Orthopaedic Surgery | Admitting: Orthopaedic Surgery

## 2021-06-12 ENCOUNTER — Encounter (HOSPITAL_COMMUNITY): Payer: Self-pay

## 2021-06-12 ENCOUNTER — Other Ambulatory Visit: Payer: Self-pay

## 2021-06-12 DIAGNOSIS — E669 Obesity, unspecified: Secondary | ICD-10-CM | POA: Diagnosis not present

## 2021-06-12 DIAGNOSIS — Z7982 Long term (current) use of aspirin: Secondary | ICD-10-CM | POA: Diagnosis not present

## 2021-06-12 DIAGNOSIS — Z9989 Dependence on other enabling machines and devices: Secondary | ICD-10-CM | POA: Insufficient documentation

## 2021-06-12 DIAGNOSIS — M1711 Unilateral primary osteoarthritis, right knee: Secondary | ICD-10-CM | POA: Insufficient documentation

## 2021-06-12 DIAGNOSIS — E785 Hyperlipidemia, unspecified: Secondary | ICD-10-CM | POA: Insufficient documentation

## 2021-06-12 DIAGNOSIS — Z20822 Contact with and (suspected) exposure to covid-19: Secondary | ICD-10-CM | POA: Diagnosis not present

## 2021-06-12 DIAGNOSIS — I10 Essential (primary) hypertension: Secondary | ICD-10-CM | POA: Insufficient documentation

## 2021-06-12 DIAGNOSIS — Z79899 Other long term (current) drug therapy: Secondary | ICD-10-CM | POA: Insufficient documentation

## 2021-06-12 DIAGNOSIS — Z7902 Long term (current) use of antithrombotics/antiplatelets: Secondary | ICD-10-CM | POA: Insufficient documentation

## 2021-06-12 DIAGNOSIS — I2510 Atherosclerotic heart disease of native coronary artery without angina pectoris: Secondary | ICD-10-CM | POA: Diagnosis not present

## 2021-06-12 DIAGNOSIS — Z01812 Encounter for preprocedural laboratory examination: Secondary | ICD-10-CM | POA: Insufficient documentation

## 2021-06-12 DIAGNOSIS — Z955 Presence of coronary angioplasty implant and graft: Secondary | ICD-10-CM | POA: Diagnosis not present

## 2021-06-12 DIAGNOSIS — G4733 Obstructive sleep apnea (adult) (pediatric): Secondary | ICD-10-CM | POA: Diagnosis not present

## 2021-06-12 DIAGNOSIS — I252 Old myocardial infarction: Secondary | ICD-10-CM | POA: Insufficient documentation

## 2021-06-12 DIAGNOSIS — Z6837 Body mass index (BMI) 37.0-37.9, adult: Secondary | ICD-10-CM | POA: Diagnosis not present

## 2021-06-12 DIAGNOSIS — Z01818 Encounter for other preprocedural examination: Secondary | ICD-10-CM

## 2021-06-12 LAB — CBC
HCT: 43.9 % (ref 39.0–52.0)
Hemoglobin: 14.3 g/dL (ref 13.0–17.0)
MCH: 29.1 pg (ref 26.0–34.0)
MCHC: 32.6 g/dL (ref 30.0–36.0)
MCV: 89.4 fL (ref 80.0–100.0)
Platelets: 272 10*3/uL (ref 150–400)
RBC: 4.91 MIL/uL (ref 4.22–5.81)
RDW: 14.1 % (ref 11.5–15.5)
WBC: 8.2 10*3/uL (ref 4.0–10.5)
nRBC: 0 % (ref 0.0–0.2)

## 2021-06-12 LAB — BASIC METABOLIC PANEL
Anion gap: 7 (ref 5–15)
BUN: 12 mg/dL (ref 8–23)
CO2: 26 mmol/L (ref 22–32)
Calcium: 9.4 mg/dL (ref 8.9–10.3)
Chloride: 106 mmol/L (ref 98–111)
Creatinine, Ser: 0.95 mg/dL (ref 0.61–1.24)
GFR, Estimated: >60 mL/min (ref >60)
Glucose, Bld: 105 mg/dL — ABNORMAL HIGH (ref 70–99)
Potassium: 3.6 mmol/L (ref 3.5–5.1)
Sodium: 139 mmol/L (ref 135–145)

## 2021-06-12 LAB — TYPE AND SCREEN
ABO/RH(D): O POS
Antibody Screen: NEGATIVE

## 2021-06-12 LAB — SURGICAL PCR SCREEN
MRSA, PCR: NEGATIVE
Staphylococcus aureus: NEGATIVE

## 2021-06-12 LAB — SARS CORONAVIRUS 2 (TAT 6-24 HRS): SARS Coronavirus 2: NEGATIVE

## 2021-06-12 NOTE — Progress Notes (Addendum)
PCP - Stefanie Libel Cardiologist - Asif Wahid Last office visit 10/25/20.  Note stated to follow up in 6 months - 04/27/21. Per patient, hasn't returned for follow up due to having surgery.  Patient stated he had hip surgery in June and everything went well.  Patient stated that he still has a spinal cord stimulator that he no longer uses  Chest x-ray - n/a EKG - 10/25/20 (Care Everywhere) Records requested Stress Test - 2018 [facility] ECHO - 03/16/20 [facility] Cardiac Cath - 04/22/14 [facility]  SA - yes, wears CPAP   Blood Thinner Instructions: Stopped ASA & Plavix 06/04/21, per patient  ERAS Protcol - yes, Ensure ordered & given   COVID TEST- 06/12/21 Per patient, has had cough and drainage since October 19th.     Anesthesia review: yes, heart history EKG tracing requested from Dr. Willeen Cass.  Patient denies shortness of breath, fever, cough and chest pain at PAT appointment   All instructions explained to the patient, with a verbal understanding of the material. Patient agrees to go over the instructions while at home for a better understanding. Patient also instructed to self quarantine after being tested for COVID-19. The opportunity to ask questions was provided.

## 2021-06-13 NOTE — Anesthesia Preprocedure Evaluation (Addendum)
Anesthesia Evaluation  Patient identified by MRN, date of birth, ID band Patient awake    Reviewed: Allergy & Precautions, NPO status , Patient's Chart, lab work & pertinent test results  Airway Mallampati: III       Dental no notable dental hx.    Pulmonary sleep apnea ,    Pulmonary exam normal        Cardiovascular hypertension, Pt. on medications + Past MI and + Cardiac Stents  Normal cardiovascular exam     Neuro/Psych negative neurological ROS  negative psych ROS   GI/Hepatic Neg liver ROS, GERD  Medicated and Controlled,  Endo/Other  negative endocrine ROS  Renal/GU negative Renal ROS  negative genitourinary   Musculoskeletal   Abdominal (+) + obese,   Peds  Hematology negative hematology ROS (+)   Anesthesia Other Findings   Reproductive/Obstetrics                            Anesthesia Physical Anesthesia Plan  ASA: 2  Anesthesia Plan: Spinal   Post-op Pain Management:  Regional for Post-op pain   Induction:   PONV Risk Score and Plan: Ondansetron, Dexamethasone, Midazolam and Treatment may vary due to age or medical condition  Airway Management Planned:   Additional Equipment: None  Intra-op Plan:   Post-operative Plan:   Informed Consent: I have reviewed the patients History and Physical, chart, labs and discussed the procedure including the risks, benefits and alternatives for the proposed anesthesia with the patient or authorized representative who has indicated his/her understanding and acceptance.     Dental advisory given  Plan Discussed with: CRNA  Anesthesia Plan Comments: (PAT note written 06/13/2021 by [name], PA-C. )       Anesthesia Quick Evaluation

## 2021-06-13 NOTE — Progress Notes (Signed)
Anesthesia Chart Review:  Case: [id number] Date/Time: 06/15/21 0715   Procedure: RIGHT TOTAL KNEE ARTHROPLASTY (Right: Knee)   Anesthesia type: Spinal   Pre-op diagnosis: osteoarthritis right knee   [facility] / [facility]   Surgeons: Kathryne Hitch, MD       DISCUSSION: Patient is a 70 year old male scheduled for the above procedure. He is s/p right THA 01/17/21.    History includes never smoker, HTN, dyslipidemia, CAD (NSTEMI, s/p DES x2 LAD 09/30/13), OSA (uses CPAP), GERD, back surgery (revision L1-pelvis fusion 09/03/16; exchange spinal cord stimulator, [employer], 07/19/17), THR (right 01/17/21). BMI is consistent with obesity. He reported that his spinal cord stimulator is not in use.    Last cardiology visit with Dr. Willeen Cass on 10/25/20. He was doing well without chest pain. Patient had not been of cardiac medications, so Toprol and pravastatin resumed. He was cleared for right TKA with permission to hold Plavix for that surgery which was done on 01/17/21 with uneventful post-operative course, except for he required multiple post-operative visits for wound check with slight dehiscence/seroma (s/p 50 cc drainage) that was managed with antibiotics and local wound care.    He reported last ASA and Plavix 06/04/21.     06/12/21 presurgical COVID-19 test was negative. Anesthesia team to evaluate on the day of surgery.   VS: BP (!) 165/86   Pulse 66   Temp 36.6 C (Oral)   Resp 18   Ht 6' (1.829 m)   Wt 125.3 kg   SpO2 97%   BMI 37.47 kg/m    PROVIDERS: Stefanie Libel, MD is PCP  Lorelei Pont, MD is cardiologist ([facility])    LABS: Labs reviewed: Acceptable for surgery. (all labs ordered are listed, but only abnormal results are displayed)  Labs Reviewed  BASIC METABOLIC PANEL - Abnormal; Notable for the following components:      Result Value   Glucose, Bld 105 (*)    All other components within normal limits  SURGICAL PCR SCREEN  SARS CORONAVIRUS 2 (TAT 6-24  HRS)  CBC  TYPE AND SCREEN    EKG: 10/25/20 [facility]): NSR, RBBB     CV: Echo 03/16/20 ([facility]): Left Ventricle: The calculated left ventricular ejection fraction is  61%.    Left Ventricle: There is moderate concentric hypertrophy.    Left Ventricle: Systolic function is normal. EF: 60-65%. ; GLS = 22.7%  from the apical 4,3,2 chamber views respectively.    Left Ventricle: Doppler parameters are indeterminate for diastolic  function.    Right Ventricle: Right ventricle is mildly dilated.    Mitral Valve: The leaflets are mildly thickened.    Aorta: The ascending aorta is mildly dilated.    Tricuspid Valve: The right ventricular systolic pressure is normal (<36  mmHg).    Pulmonic Valve: The pulmonic valve was not well visualized.     Exercise stress echo 06/13/15 [facility]): STRESS ECHO  Normal left ventricular function and global wall motion with stress. There  were no segmental wall motion abnormalities post exercise. There was normal  increase in global LV function post exercise. The estimated LV ejection  fraction is 60-65% with stress. Negative exercise echocardiography for  inducible ischemia at subtarget heart rate achieved.      Cardiac cath 04/22/14 ([facility]): Impression:  1. Patent LAD stent. Borderline lesion noted in the proximal portion of the IM branch. Mild RCA lesion noted  2. Normal LV function   Plan:  1.  Aggressive medical therapy.  Would consider pressure wire measurement if he continues to have chest pain.  2. Risk stratification     Cardiac cath 09/30/13 ([facility]): Findings:  1. Hemodynamics: Aortic pressure 174/71, LVEDP 22  2. Coronary system:    Left Main: Large caliber vessel with no angiographic evidence of stenosis.    LAD system: Large caliber vessel, wraps around the apex. 540% ostial disease, 99% proximal stenosis with probable thrombus    LCX system: moderate caliber vessel.  40% proximal disease.    RCA system: right  dominant. 60% distal stenosis.   Conclusion:   1. Successful intervention to the LAD artery using 2.75x12 and 2.75x18 mm Alpine DES  stent. 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  Pre-intervention stenosis 99% with probable thrombus, post-intervention stenosis 0 %     Past Medical History:  Diagnosis Date   Arthritis    Back, hand, elbows, knees, hips   GERD (gastroesophageal reflux disease)    Hypertension    Myocardial infarction [facility])    Sleep apnea     Past Surgical History:  Procedure Laterality Date   BACK SURGERY     Lower back fusion   CORONARY STENT PLACEMENT  2014   EYE SURGERY     Bilateral cataract removal   SPINAL CORD STIMULATOR IMPLANT     TOTAL HIP ARTHROPLASTY Right 01/17/2021   Procedure: RIGHT TOTAL HIP ARTHROPLASTY ANTERIOR APPROACH;  Surgeon: Kathryne Hitch, MD;  Location: [facility] OR;  Service: Orthopedics;  Laterality: Right;  Spinal was attempted with patients consent.  Patient has spinal stimulator and unable to get access.    MEDICATIONS:  amLODipine (NORVASC) 10 MG tablet   aspirin 81 MG chewable tablet   clopidogrel (PLAVIX) 75 MG tablet   clotrimazole-betamethasone (LOTRISONE) cream   cyclobenzaprine (FLEXERIL) 10 MG tablet   doxycycline (VIBRA-TABS) 100 MG tablet   furosemide (LASIX) 20 MG tablet   guaiFENesin (ROBITUSSIN) 100 MG/5ML liquid   HYDROcodone-ibuprofen (VICOPROFEN) 7.5-200 MG tablet   losartan (COZAAR) 100 MG tablet   metoprolol succinate (TOPROL-XL) 50 MG 24 hr tablet   NON FORMULARY   omeprazole (PRILOSEC) 20 MG capsule   oxyCODONE (OXY IR/ROXICODONE) 5 MG immediate release tablet   phenylephrine (NEO-SYNEPHRINE) 1 % nasal spray   pravastatin (PRAVACHOL) 20 MG tablet   Sennosides 25 MG TABS   tamsulosin (FLOMAX) 0.4 MG CAPS capsule   No current facility-administered medications for this encounter.    [name], PA-C Surgical Short Stay/Anesthesiology [facility] Phone [phone] [facility] Phone  [phone] 06/13/2021 3:16 PM

## 2021-06-14 DIAGNOSIS — M1711 Unilateral primary osteoarthritis, right knee: Secondary | ICD-10-CM

## 2021-06-14 NOTE — H&P (Signed)
TOTAL KNEE ADMISSION H&P  Patient is being admitted for right total knee arthroplasty.  Subjective:  Chief Complaint:right knee pain.  HPI: Jordan [name], 70 y.o. male, has a history of pain and functional disability in the right knee due to arthritis and has failed non-surgical conservative treatments for greater than 12 weeks to includeNSAID's and/or analgesics, corticosteriod injections, flexibility and strengthening excercises, use of assistive devices, weight reduction as appropriate, and activity modification.  Onset of symptoms was gradual, starting 5 years ago with gradually worsening course since that time. The patient noted no past surgery on the right knee(s).  Patient currently rates pain in the right knee(s) at 10 out of 10 with activity. Patient has night pain, worsening of pain with activity and weight bearing, pain that interferes with activities of daily living, pain with passive range of motion, crepitus, and joint swelling.  Patient has evidence of subchondral sclerosis, periarticular osteophytes, and joint space narrowing by imaging studies. There is no active infection.  Patient Active Problem List   Diagnosis Date Noted   Arthritis of right knee 06/14/2021   Status post total replacement of right hip 01/17/2021   Unilateral primary osteoarthritis, right hip 11/09/2020   Past Medical History:  Diagnosis Date   Arthritis    Back, hand, elbows, knees, hips   GERD (gastroesophageal reflux disease)    Hypertension    Myocardial infarction [facility])    Sleep apnea     Past Surgical History:  Procedure Laterality Date   BACK SURGERY     Lower back fusion   CORONARY STENT PLACEMENT  2014   EYE SURGERY     Bilateral cataract removal   SPINAL CORD STIMULATOR IMPLANT     TOTAL HIP ARTHROPLASTY Right 01/17/2021   Procedure: RIGHT TOTAL HIP ARTHROPLASTY ANTERIOR APPROACH;  Surgeon: Kathryne Hitch, MD;  Location: [facility] OR;  Service: Orthopedics;  Laterality: Right;  Spinal  was attempted with patients consent.  Patient has spinal stimulator and unable to get access.    No current facility-administered medications for this encounter.   Current Outpatient Medications  Medication Sig Dispense Refill Last Dose   amLODipine (NORVASC) 10 MG tablet Take 10 mg by mouth daily.      aspirin 81 MG chewable tablet Chew 81 mg by mouth daily.      clopidogrel (PLAVIX) 75 MG tablet Take 75 mg by mouth daily.      clotrimazole-betamethasone (LOTRISONE) cream Apply 1 application topically daily as needed (psoriasis).      cyclobenzaprine (FLEXERIL) 10 MG tablet Take 10 mg by mouth 2 (two) times daily as needed for muscle spasms.      doxycycline (VIBRA-TABS) 100 MG tablet Take 1 tablet (100 mg total) by mouth 2 (two) times daily. 30 tablet 0 06/06/2021   furosemide (LASIX) 20 MG tablet Take 10 mg by mouth daily.      guaiFENesin (ROBITUSSIN) 100 MG/5ML liquid Take 10 mLs by mouth every 4 (four) hours as needed for cough or to loosen phlegm.      HYDROcodone-ibuprofen (VICOPROFEN) 7.5-200 MG tablet Take 1 tablet by mouth 2 (two) times daily as needed for moderate pain.      losartan (COZAAR) 100 MG tablet Take 100 mg by mouth daily.      metoprolol succinate (TOPROL-XL) 50 MG 24 hr tablet Take 50 mg by mouth daily. Take with or immediately following a meal.      NON FORMULARY Pt uses a cpap nightly      omeprazole (PRILOSEC)  20 MG capsule Take 20 mg by mouth 2 (two) times daily before a meal.      phenylephrine (NEO-SYNEPHRINE) 1 % nasal spray Place 1-2 drops into both nostrils every 6 (six) hours as needed for congestion.      pravastatin (PRAVACHOL) 20 MG tablet Take 20 mg by mouth daily.      Sennosides 25 MG TABS Take 25 mg by mouth daily as needed (constipation).      tamsulosin (FLOMAX) 0.4 MG CAPS capsule Take 0.4 mg by mouth daily after breakfast.      oxyCODONE (OXY IR/ROXICODONE) 5 MG immediate release tablet Take 1-2 tablets (5-10 mg total) by mouth every 4 (four) hours  as needed for moderate pain (pain score 4-6). (Patient not taking: No sig reported) 30 tablet 0 Not Taking   Allergies  Allergen Reactions   Iodinated Diagnostic Agents Shortness Of Breath, Itching and Other (See Comments)    Burning sensation   Gabapentin Hives, Rash and Other (See Comments)    Burning sensation    Social History   Tobacco Use   Smoking status: Never   Smokeless tobacco: Never  Substance Use Topics   Alcohol use: Yes    Comment: Drinks no more than 2 cases of beer per year    No family history on file.   Review of Systems  Musculoskeletal:  Positive for gait problem and joint swelling.  All other systems reviewed and are negative.  Objective:  Physical Exam Constitutional:      Appearance: Normal appearance.  HENT:     Head: Normocephalic and atraumatic.  Eyes:     Extraocular Movements: Extraocular movements intact.     Pupils: Pupils are equal, round, and reactive to light.  Cardiovascular:     Rate and Rhythm: Normal rate.  Pulmonary:     Effort: Pulmonary effort is normal.     Breath sounds: Normal breath sounds.  Abdominal:     Palpations: Abdomen is soft.  Musculoskeletal:     Cervical back: Normal range of motion and neck supple.     Right knee: Effusion, bony tenderness and crepitus present. Decreased range of motion. Tenderness present over the medial joint line, lateral joint line and patellar tendon. Abnormal alignment.  Neurological:     Mental Status: He is alert and oriented to person, place, and time.  Psychiatric:        Behavior: Behavior normal.    Vital signs in last 24 hours:    Labs:   Estimated body mass index is 37.47 kg/m as calculated from the following:   Height as of 06/12/21: 6' (1.829 m).   Weight as of 06/12/21: 125.3 kg.   Imaging Review Plain radiographs demonstrate severe degenerative joint disease of the right knee(s). The overall alignment ismild varus. The bone quality appears to be excellent for age  and reported activity level.      Assessment/Plan:  End stage arthritis, right knee   The patient history, physical examination, clinical judgment of the provider and imaging studies are consistent with end stage degenerative joint disease of the right knee(s) and total knee arthroplasty is deemed medically necessary. The treatment options including medical management, injection therapy arthroscopy and arthroplasty were discussed at length. The risks and benefits of total knee arthroplasty were presented and reviewed. The risks due to aseptic loosening, infection, stiffness, patella tracking problems, thromboembolic complications and other imponderables were discussed. The patient acknowledged the explanation, agreed to proceed with the plan and consent was signed. Patient  is being admitted for inpatient treatment for surgery, pain control, PT, OT, prophylactic antibiotics, VTE prophylaxis, progressive ambulation and ADL's and discharge planning. The patient is planning to be discharged home with home health services

## 2021-06-15 ENCOUNTER — Other Ambulatory Visit: Payer: Self-pay

## 2021-06-15 ENCOUNTER — Observation Stay (HOSPITAL_COMMUNITY): Payer: Medicare Other

## 2021-06-15 ENCOUNTER — Encounter (HOSPITAL_COMMUNITY): Payer: Self-pay | Admitting: Orthopaedic Surgery

## 2021-06-15 ENCOUNTER — Ambulatory Visit (HOSPITAL_COMMUNITY): Payer: Medicare Other | Admitting: Physician Assistant

## 2021-06-15 ENCOUNTER — Observation Stay (HOSPITAL_COMMUNITY)
Admission: RE | Admit: 2021-06-15 | Discharge: 2021-06-16 | Disposition: A | Discharge status: Home / Self Care | Payer: Medicare Other | Attending: Orthopaedic Surgery | Admitting: Orthopaedic Surgery

## 2021-06-15 ENCOUNTER — Ambulatory Visit (HOSPITAL_COMMUNITY): Payer: Medicare Other | Admitting: Certified Registered"

## 2021-06-15 ENCOUNTER — Encounter (HOSPITAL_COMMUNITY)
Admission: RE | Disposition: A | Discharge status: Home / Self Care | Payer: Self-pay | Source: Home / Self Care | Attending: Orthopaedic Surgery

## 2021-06-15 DIAGNOSIS — I10 Essential (primary) hypertension: Secondary | ICD-10-CM | POA: Insufficient documentation

## 2021-06-15 DIAGNOSIS — M1711 Unilateral primary osteoarthritis, right knee: Secondary | ICD-10-CM | POA: Diagnosis present

## 2021-06-15 DIAGNOSIS — Z7982 Long term (current) use of aspirin: Secondary | ICD-10-CM | POA: Diagnosis not present

## 2021-06-15 DIAGNOSIS — Z79899 Other long term (current) drug therapy: Secondary | ICD-10-CM | POA: Insufficient documentation

## 2021-06-15 DIAGNOSIS — Z96641 Presence of right artificial hip joint: Secondary | ICD-10-CM | POA: Insufficient documentation

## 2021-06-15 DIAGNOSIS — Z96651 Presence of right artificial knee joint: Secondary | ICD-10-CM

## 2021-06-15 HISTORY — PX: TOTAL KNEE ARTHROPLASTY: SHX125

## 2021-06-15 SURGERY — ARTHROPLASTY, KNEE, TOTAL
Anesthesia: Spinal | Site: Knee | Laterality: Right

## 2021-06-15 MED ORDER — CEFAZOLIN IN SODIUM CHLORIDE 3-0.9 GM/100ML-% IV SOLN
3.0000 g | INTRAVENOUS | Status: AC
  Administered 2021-06-15: 3 g via INTRAVENOUS

## 2021-06-15 MED ORDER — CHLORHEXIDINE GLUCONATE 0.12 % MT SOLN: 15.0000 mL | Freq: Once | OROMUCOSAL | Status: AC

## 2021-06-15 MED ORDER — ROPIVACAINE HCL 5 MG/ML IJ SOLN
INTRAMUSCULAR | Status: DC | PRN
  Administered 2021-06-15 (×6): 5 mL via PERINEURAL

## 2021-06-15 MED ORDER — LIDOCAINE HCL (CARDIAC) PF 100 MG/5ML IV SOSY
PREFILLED_SYRINGE | INTRAVENOUS | Status: DC | PRN
  Administered 2021-06-15: 100 mg via INTRATRACHEAL

## 2021-06-15 MED ORDER — CHLORHEXIDINE GLUCONATE 0.12 % MT SOLN
OROMUCOSAL | Status: AC
  Administered 2021-06-15: 15 mL via OROMUCOSAL
  Filled 2021-06-15: qty 15

## 2021-06-15 MED ORDER — DEXAMETHASONE SODIUM PHOSPHATE 10 MG/ML IJ SOLN
INTRAMUSCULAR | Status: DC | PRN
  Administered 2021-06-15: 10 mg via INTRAVENOUS

## 2021-06-15 MED ORDER — POVIDONE-IODINE 10 % EX SWAB
2.0000 "application " | Freq: Once | CUTANEOUS | Status: AC
  Administered 2021-06-15: 2 via TOPICAL

## 2021-06-15 MED ORDER — SUCCINYLCHOLINE 20MG/ML (10ML) SYRINGE FOR MEDFUSION PUMP - OPTIME
INTRAMUSCULAR | Status: DC | PRN
  Administered 2021-06-15: 200 mg via INTRAVENOUS

## 2021-06-15 MED ORDER — DIPHENHYDRAMINE HCL 12.5 MG/5ML PO ELIX: 12.5000 mg | ORAL_SOLUTION | ORAL | Status: DC | PRN

## 2021-06-15 MED ORDER — TRANEXAMIC ACID-NACL 1000-0.7 MG/100ML-% IV SOLN
INTRAVENOUS | Status: AC
  Filled 2021-06-15: qty 100

## 2021-06-15 MED ORDER — LACTATED RINGERS IV SOLN: INTRAVENOUS | Status: DC

## 2021-06-15 MED ORDER — PHENOL 1.4 % MT LIQD: 1.0000 | OROMUCOSAL | Status: DC | PRN

## 2021-06-15 MED ORDER — ALUM & MAG HYDROXIDE-SIMETH 200-200-20 MG/5ML PO SUSP: 30.0000 mL | ORAL | Status: DC | PRN

## 2021-06-15 MED ORDER — PANTOPRAZOLE SODIUM 40 MG PO TBEC
40.0000 mg | DELAYED_RELEASE_TABLET | Freq: Every day | ORAL | Status: DC
  Administered 2021-06-15 – 2021-06-16 (×2): 40 mg via ORAL
  Filled 2021-06-15 (×2): qty 1

## 2021-06-15 MED ORDER — POLYETHYLENE GLYCOL 3350 17 G PO PACK: 17.0000 g | PACK | Freq: Every day | ORAL | Status: DC | PRN

## 2021-06-15 MED ORDER — SODIUM CHLORIDE 0.9 % IV SOLN: INTRAVENOUS | Status: DC

## 2021-06-15 MED ORDER — HYDROMORPHONE HCL 1 MG/ML IJ SOLN
1.0000 mg | INTRAMUSCULAR | Status: DC | PRN
  Administered 2021-06-16 (×3): 1 mg via INTRAVENOUS
  Filled 2021-06-15 (×3): qty 1

## 2021-06-15 MED ORDER — ROCURONIUM 10MG/ML (10ML) SYRINGE FOR MEDFUSION PUMP - OPTIME
INTRAVENOUS | Status: DC | PRN
  Administered 2021-06-15: 100 mg via INTRAVENOUS

## 2021-06-15 MED ORDER — PROPOFOL 500 MG/50ML IV EMUL
INTRAVENOUS | Status: DC | PRN
  Administered 2021-06-15: 200 mg via INTRAVENOUS

## 2021-06-15 MED ORDER — CLOPIDOGREL BISULFATE 75 MG PO TABS
75.0000 mg | ORAL_TABLET | Freq: Every day | ORAL | Status: DC
  Administered 2021-06-16: 75 mg via ORAL
  Filled 2021-06-15: qty 1

## 2021-06-15 MED ORDER — HYDROMORPHONE HCL 1 MG/ML IJ SOLN
INTRAMUSCULAR | Status: AC
  Filled 2021-06-15: qty 2

## 2021-06-15 MED ORDER — TRANEXAMIC ACID-NACL 1000-0.7 MG/100ML-% IV SOLN
1000.0000 mg | INTRAVENOUS | Status: AC
  Administered 2021-06-15: 1000 mg via INTRAVENOUS

## 2021-06-15 MED ORDER — OXYCODONE HCL 5 MG PO TABS
10.0000 mg | ORAL_TABLET | ORAL | Status: DC | PRN
  Filled 2021-06-15: qty 3

## 2021-06-15 MED ORDER — ONDANSETRON HCL 4 MG PO TABS: 4.0000 mg | ORAL_TABLET | Freq: Four times a day (QID) | ORAL | Status: DC | PRN

## 2021-06-15 MED ORDER — HYDROMORPHONE HCL 1 MG/ML IJ SOLN
0.2500 mg | INTRAMUSCULAR | Status: DC | PRN
  Administered 2021-06-15 (×4): 0.5 mg via INTRAVENOUS

## 2021-06-15 MED ORDER — ORAL CARE MOUTH RINSE: 15.0000 mL | Freq: Once | OROMUCOSAL | Status: AC

## 2021-06-15 MED ORDER — OXYCODONE HCL 5 MG PO TABS
5.0000 mg | ORAL_TABLET | ORAL | Status: DC | PRN
  Administered 2021-06-15 – 2021-06-16 (×4): 10 mg via ORAL
  Administered 2021-06-16: 5 mg via ORAL
  Filled 2021-06-15 (×4): qty 2

## 2021-06-15 MED ORDER — 0.9 % SODIUM CHLORIDE (POUR BTL) OPTIME
TOPICAL | Status: DC | PRN
  Administered 2021-06-15: 1000 mL

## 2021-06-15 MED ORDER — CEFAZOLIN IN SODIUM CHLORIDE 3-0.9 GM/100ML-% IV SOLN
INTRAVENOUS | Status: AC
  Filled 2021-06-15: qty 100

## 2021-06-15 MED ORDER — ONDANSETRON HCL 4 MG/2ML IJ SOLN
INTRAMUSCULAR | Status: DC | PRN
  Administered 2021-06-15: 4 mg via INTRAVENOUS

## 2021-06-15 MED ORDER — METHOCARBAMOL 500 MG PO TABS
500.0000 mg | ORAL_TABLET | Freq: Four times a day (QID) | ORAL | Status: DC | PRN
  Administered 2021-06-15 – 2021-06-16 (×2): 500 mg via ORAL
  Filled 2021-06-15 (×2): qty 1

## 2021-06-15 MED ORDER — HYDROMORPHONE HCL 1 MG/ML IJ SOLN
INTRAMUSCULAR | Status: AC
  Filled 2021-06-15: qty 1

## 2021-06-15 MED ORDER — FUROSEMIDE 20 MG PO TABS
10.0000 mg | ORAL_TABLET | Freq: Every day | ORAL | Status: DC
  Administered 2021-06-15 – 2021-06-16 (×2): 10 mg via ORAL
  Filled 2021-06-15 (×2): qty 1

## 2021-06-15 MED ORDER — FENTANYL CITRATE (PF) 250 MCG/5ML IJ SOLN
INTRAMUSCULAR | Status: AC
  Filled 2021-06-15: qty 5

## 2021-06-15 MED ORDER — ACETAMINOPHEN 325 MG PO TABS: 325.0000 mg | ORAL_TABLET | Freq: Four times a day (QID) | ORAL | Status: DC | PRN

## 2021-06-15 MED ORDER — SUGAMMADEX SODIUM 200 MG/2ML IV SOLN
INTRAVENOUS | Status: DC | PRN
  Administered 2021-06-15: 200 mg via INTRAVENOUS

## 2021-06-15 MED ORDER — MENTHOL 3 MG MT LOZG: 1.0000 | LOZENGE | OROMUCOSAL | Status: DC | PRN

## 2021-06-15 MED ORDER — CEFAZOLIN SODIUM-DEXTROSE 2-4 GM/100ML-% IV SOLN
2.0000 g | Freq: Four times a day (QID) | INTRAVENOUS | Status: AC
  Administered 2021-06-15 (×2): 2 g via INTRAVENOUS
  Filled 2021-06-15 (×2): qty 100

## 2021-06-15 MED ORDER — MIDAZOLAM HCL 2 MG/2ML IJ SOLN
INTRAMUSCULAR | Status: AC
  Filled 2021-06-15: qty 2

## 2021-06-15 MED ORDER — METOCLOPRAMIDE HCL 5 MG PO TABS: 5.0000 mg | ORAL_TABLET | Freq: Three times a day (TID) | ORAL | Status: DC | PRN

## 2021-06-15 MED ORDER — METOPROLOL SUCCINATE ER 25 MG PO TB24
50.0000 mg | ORAL_TABLET | Freq: Every day | ORAL | Status: DC
  Administered 2021-06-16: 50 mg via ORAL
  Filled 2021-06-15: qty 2

## 2021-06-15 MED ORDER — FENTANYL CITRATE (PF) 250 MCG/5ML IJ SOLN
INTRAMUSCULAR | Status: DC | PRN
  Administered 2021-06-15: 50 ug via INTRAVENOUS
  Administered 2021-06-15: 100 ug via INTRAVENOUS
  Administered 2021-06-15 (×2): 50 ug via INTRAVENOUS

## 2021-06-15 MED ORDER — PROPOFOL 10 MG/ML IV BOLUS
INTRAVENOUS | Status: AC
  Filled 2021-06-15: qty 20

## 2021-06-15 MED ORDER — SODIUM CHLORIDE 0.9 % IR SOLN
Status: DC | PRN
  Administered 2021-06-15: 3000 mL

## 2021-06-15 MED ORDER — PRAVASTATIN SODIUM 10 MG PO TABS
20.0000 mg | ORAL_TABLET | Freq: Every day | ORAL | Status: DC
  Administered 2021-06-15 – 2021-06-16 (×2): 20 mg via ORAL
  Filled 2021-06-15 (×2): qty 2

## 2021-06-15 MED ORDER — LOSARTAN POTASSIUM 50 MG PO TABS
100.0000 mg | ORAL_TABLET | Freq: Every day | ORAL | Status: DC
  Administered 2021-06-16: 100 mg via ORAL
  Filled 2021-06-15 (×2): qty 2

## 2021-06-15 MED ORDER — LACTATED RINGERS IV SOLN: INTRAVENOUS | Status: DC | PRN

## 2021-06-15 MED ORDER — ONDANSETRON HCL 4 MG/2ML IJ SOLN: 4.0000 mg | Freq: Four times a day (QID) | INTRAMUSCULAR | Status: DC | PRN

## 2021-06-15 MED ORDER — ACETAMINOPHEN 10 MG/ML IV SOLN
INTRAVENOUS | Status: DC | PRN
  Administered 2021-06-15: 1000 mg via INTRAVENOUS

## 2021-06-15 MED ORDER — TAMSULOSIN HCL 0.4 MG PO CAPS
0.4000 mg | ORAL_CAPSULE | Freq: Every day | ORAL | Status: DC
  Administered 2021-06-15 – 2021-06-16 (×2): 0.4 mg via ORAL
  Filled 2021-06-15 (×2): qty 1

## 2021-06-15 MED ORDER — MIDAZOLAM HCL 2 MG/2ML IJ SOLN
INTRAMUSCULAR | Status: DC | PRN
  Administered 2021-06-15: 2 mg via INTRAVENOUS

## 2021-06-15 MED ORDER — METHOCARBAMOL 1000 MG/10ML IJ SOLN
500.0000 mg | Freq: Four times a day (QID) | INTRAVENOUS | Status: DC | PRN
  Filled 2021-06-15: qty 5

## 2021-06-15 MED ORDER — HYDROMORPHONE HCL 1 MG/ML IJ SOLN
0.2500 mg | INTRAMUSCULAR | Status: DC | PRN
  Administered 2021-06-15 (×2): 0.5 mg via INTRAVENOUS

## 2021-06-15 MED ORDER — ASPIRIN 81 MG PO CHEW
81.0000 mg | CHEWABLE_TABLET | Freq: Every day | ORAL | Status: DC
  Administered 2021-06-16: 81 mg via ORAL
  Filled 2021-06-15: qty 1

## 2021-06-15 MED ORDER — METOCLOPRAMIDE HCL 5 MG/ML IJ SOLN: 5.0000 mg | Freq: Three times a day (TID) | INTRAMUSCULAR | Status: DC | PRN

## 2021-06-15 MED ORDER — DOCUSATE SODIUM 100 MG PO CAPS
100.0000 mg | ORAL_CAPSULE | Freq: Two times a day (BID) | ORAL | Status: DC
  Administered 2021-06-15 – 2021-06-16 (×3): 100 mg via ORAL
  Filled 2021-06-15 (×3): qty 1

## 2021-06-15 MED ORDER — AMLODIPINE BESYLATE 10 MG PO TABS
10.0000 mg | ORAL_TABLET | Freq: Every day | ORAL | Status: DC
  Administered 2021-06-15 – 2021-06-16 (×2): 10 mg via ORAL
  Filled 2021-06-15 (×2): qty 1

## 2021-06-15 SURGICAL SUPPLY — 66 items
BAG COUNTER SPONGE SURGICOUNT (BAG) ×2 IMPLANT
BANDAGE ESMARK 6X9 LF (GAUZE/BANDAGES/DRESSINGS) ×1 IMPLANT
BLADE SAG 18X100X1.27 (BLADE) ×2 IMPLANT
BNDG ELASTIC 6X5.8 VLCR STR LF (GAUZE/BANDAGES/DRESSINGS) ×2 IMPLANT
BNDG ESMARK 6X9 LF (GAUZE/BANDAGES/DRESSINGS) ×2
BOWL SMART MIX CTS (DISPOSABLE) ×2 IMPLANT
COVER SURGICAL LIGHT HANDLE (MISCELLANEOUS) ×2 IMPLANT
CUFF TOURN SGL QUICK 34 (TOURNIQUET CUFF) ×1
CUFF TOURN SGL QUICK 42 (TOURNIQUET CUFF) IMPLANT
CUFF TRNQT CYL 34X4.125X (TOURNIQUET CUFF) ×1 IMPLANT
DRAPE EXTREMITY T 121X128X90 (DISPOSABLE) ×2 IMPLANT
DRAPE HALF SHEET 40X57 (DRAPES) ×2 IMPLANT
DRAPE U-SHAPE 47X51 STRL (DRAPES) ×2 IMPLANT
DRSG PAD ABDOMINAL 8X10 ST (GAUZE/BANDAGES/DRESSINGS) ×2 IMPLANT
DRSG XEROFORM 1X8 (GAUZE/BANDAGES/DRESSINGS) ×2 IMPLANT
DURAPREP 26ML APPLICATOR (WOUND CARE) ×2 IMPLANT
ELECT CAUTERY BLADE 6.4 (BLADE) ×2 IMPLANT
ELECT REM PT RETURN 9FT ADLT (ELECTROSURGICAL) ×2
ELECTRODE REM PT RTRN 9FT ADLT (ELECTROSURGICAL) ×1 IMPLANT
FACESHIELD WRAPAROUND (MASK) ×4 IMPLANT
FEMORAL POSTERIOR SZ5 RT (Femur) ×1 IMPLANT
GAUZE SPONGE 4X4 12PLY STRL (GAUZE/BANDAGES/DRESSINGS) ×2 IMPLANT
GAUZE SPONGE 4X4 12PLY STRL LF (GAUZE/BANDAGES/DRESSINGS) ×2 IMPLANT
GAUZE XEROFORM 1X8 LF (GAUZE/BANDAGES/DRESSINGS) ×2 IMPLANT
GLOVE SRG 8 PF TXTR STRL LF DI (GLOVE) ×2 IMPLANT
GLOVE SURG ORTHO LTX SZ7.5 (GLOVE) ×2 IMPLANT
GLOVE SURG ORTHO LTX SZ8 (GLOVE) ×2 IMPLANT
GLOVE SURG UNDER POLY LF SZ8 (GLOVE) ×2
GOWN STRL REUS W/ TWL LRG LVL3 (GOWN DISPOSABLE) IMPLANT
GOWN STRL REUS W/ TWL XL LVL3 (GOWN DISPOSABLE) ×2 IMPLANT
GOWN STRL REUS W/TWL LRG LVL3 (GOWN DISPOSABLE)
GOWN STRL REUS W/TWL XL LVL3 (GOWN DISPOSABLE) ×2
HANDPIECE INTERPULSE COAX TIP (DISPOSABLE) ×1
IMMOBILIZER KNEE 22 UNIV (SOFTGOODS) ×2 IMPLANT
INSERT TIB BEAR X3 (Insert) ×2 IMPLANT
KIT BASIN OR (CUSTOM PROCEDURE TRAY) ×2 IMPLANT
KIT TURNOVER KIT B (KITS) ×2 IMPLANT
KNEE PATELLA ASYMMETRIC 10X35 (Knees) ×2 IMPLANT
KNEE TIBIAL COMPONENT SZ5 (Knees) ×2 IMPLANT
MANIFOLD NEPTUNE II (INSTRUMENTS) ×2 IMPLANT
NEEDLE 18GX1X1/2 (RX/OR ONLY) (NEEDLE) IMPLANT
NS IRRIG 1000ML POUR BTL (IV SOLUTION) ×2 IMPLANT
PACK TOTAL JOINT (CUSTOM PROCEDURE TRAY) ×2 IMPLANT
PAD ABD 8X10 STRL (GAUZE/BANDAGES/DRESSINGS) ×2 IMPLANT
PAD ARMBOARD 7.5X6 YLW CONV (MISCELLANEOUS) ×2 IMPLANT
PADDING CAST COTTON 6X4 STRL (CAST SUPPLIES) ×2 IMPLANT
PIN FLUTED HEDLESS FIX 3.5X1/8 (PIN) ×2 IMPLANT
POSTERIOR FEMORAL SZ5 RT (Femur) ×2 IMPLANT
SET HNDPC FAN SPRY TIP SCT (DISPOSABLE) ×1 IMPLANT
SET PAD KNEE POSITIONER (MISCELLANEOUS) ×2 IMPLANT
STAPLER VISISTAT 35W (STAPLE) IMPLANT
STRIP CLOSURE SKIN 1/2X4 (GAUZE/BANDAGES/DRESSINGS) IMPLANT
SUCTION FRAZIER HANDLE 10FR (MISCELLANEOUS) ×1
SUCTION TUBE FRAZIER 10FR DISP (MISCELLANEOUS) ×1 IMPLANT
SUT MNCRL AB 4-0 PS2 18 (SUTURE) IMPLANT
SUT VIC AB 0 CT1 27 (SUTURE) ×1
SUT VIC AB 0 CT1 27XBRD ANBCTR (SUTURE) ×1 IMPLANT
SUT VIC AB 1 CT1 27 (SUTURE) ×2
SUT VIC AB 1 CT1 27XBRD ANBCTR (SUTURE) ×2 IMPLANT
SUT VIC AB 2-0 CT1 27 (SUTURE) ×2
SUT VIC AB 2-0 CT1 TAPERPNT 27 (SUTURE) ×2 IMPLANT
SYR 50ML LL SCALE MARK (SYRINGE) IMPLANT
TOWEL GREEN STERILE (TOWEL DISPOSABLE) ×2 IMPLANT
TOWEL GREEN STERILE FF (TOWEL DISPOSABLE) ×2 IMPLANT
TRAY CATH 16FR W/PLASTIC CATH (SET/KITS/TRAYS/PACK) IMPLANT
WRAP KNEE MAXI GEL POST OP (GAUZE/BANDAGES/DRESSINGS) ×2 IMPLANT

## 2021-06-15 NOTE — Brief Op Note (Signed)
06/15/2021  9:19 AM  PATIENT:  Jordan [name]  70 y.o. male  PRE-OPERATIVE DIAGNOSIS:  osteoarthritis right knee  POST-OPERATIVE DIAGNOSIS:  osteoarthritis right knee  PROCEDURE:  Procedure(s): RIGHT TOTAL KNEE ARTHROPLASTY (Right)  SURGEON:  Surgeon(s) and Role:    Kathryne Hitch, MD - Primary  PHYSICIAN ASSISTANT:  Rexene Edison, PA-C  ANESTHESIA:   regional and general  COUNTS:  YES  TOURNIQUET:   Total Tourniquet Time Documented: Thigh (Right) - 45 minutes Total: Thigh (Right) - 45 minutes   DICTATION: .Other Dictation: Dictation Number [id number]  PLAN OF CARE: Admit for overnight observation  PATIENT DISPOSITION:  PACU - hemodynamically stable.   Delay start of Pharmacological VTE agent (>24hrs) due to surgical blood loss or risk of bleeding: no

## 2021-06-15 NOTE — Anesthesia Procedure Notes (Signed)
Anesthesia Regional Block: Adductor canal block   Pre-Anesthetic Checklist: , timeout performed,  Correct Patient, Correct Site, Correct Laterality,  Correct Procedure, Correct Position, site marked,  Risks and benefits discussed,  Surgical consent,  Pre-op evaluation,  At surgeon's request and post-op pain management  Laterality: Lower and Right  Prep: chloraprep       Needles:  Injection technique: Single-shot  Needle Type: Echogenic Stimulator Needle     Needle Length: 9cm  Needle Gauge: 20   Needle insertion depth: 3 cm   Additional Needles:   Procedures:,,,, ultrasound used (permanent image in chart),,    Narrative:  Start time: 06/15/2021 7:10 AM End time: 06/15/2021 7:20 AM Anesthesiologist: Leilani Able, MD

## 2021-06-15 NOTE — Transfer of Care (Signed)
Immediate Anesthesia Transfer of Care Note  Patient: Jordan [name]  Procedure(s) Performed: RIGHT TOTAL KNEE ARTHROPLASTY (Right: Knee)  Patient Location: PACU  Anesthesia Type:GA combined with regional for post-op pain  Level of Consciousness: awake, alert , oriented and patient cooperative  Airway & Oxygen Therapy: Patient Spontanous Breathing and Patient connected to nasal cannula oxygen  Post-op Assessment: Report given to RN, Post -op Vital signs reviewed and stable and Patient moving all extremities X 4  Post vital signs: Reviewed and stable  Last Vitals:  Vitals Value Taken Time  BP    Temp    Pulse 65 06/15/21 0937  Resp 15 06/15/21 0937  SpO2 100 % 06/15/21 0937  Vitals shown include unvalidated device data.  Last Pain:  Vitals:   06/15/21 0603  TempSrc:   PainSc: 2       Patients Stated Pain Goal: 2 (06/15/21 0603)  Complications: No notable events documented.

## 2021-06-15 NOTE — Care Management (Signed)
Patient was set up by MD office for home health with [facility] . Will follow

## 2021-06-15 NOTE — TOC Initial Note (Signed)
Transition of Care [facility]) - Initial/Assessment Note    Patient Details  Name: Jordan [name] MRN: [id number] Date of Birth: 01-Sep-1950  [facility]) CM/SW Contact:    Kingsley Plan, RN Phone Number: 06/15/2021, 4:15 PM  Clinical Narrative:                 Spoke with patient and family at bedside. Discussed home health PT with patient, patient in agreement. MD office already arranged [facility], patient in agreement.   Patient has walker at home already.   Expected Discharge Plan: Home w Home Health Services Barriers to Discharge: Continued Medical Work up   Patient Goals and CMS Choice Patient states their goals for this hospitalization and ongoing recovery are:: to return to home CMS Medicare.gov Compare Post Acute Care list provided to:: Patient Choice offered to / list presented to : Patient  Expected Discharge Plan and Services Expected Discharge Plan: Home w Home Health Services   Discharge Planning Services: CM Consult Post Acute Care Choice: Home Health                   DME Arranged: N/A         HH Arranged: PT HH Agency: [facility] (now [facility]) Date HH Agency Contacted: 06/15/21 Time HH Agency Contacted: 1614 Representative spoke with at [facility] Agency: Stacie  Prior Living Arrangements/Services     Patient language and need for interpreter reviewed:: Yes Do you feel safe going back to the place where you live?: Yes      Need for Family Participation in Patient Care: Yes (Comment) Care giver support system in place?: Yes (comment) Current home services: DME Criminal Activity/Legal Involvement Pertinent to Current Situation/Hospitalization: No - Comment as needed  Activities of Daily Living Home Assistive Devices/Equipment: Eyeglasses, Blood pressure cuff, Hearing aid ADL Screening (condition at time of admission) Patient's cognitive ability adequate to safely complete daily activities?: Yes Is the patient deaf or have  difficulty hearing?: No Does the patient have difficulty seeing, even when wearing glasses/contacts?: No Does the patient have difficulty concentrating, remembering, or making decisions?: No Patient able to express need for assistance with ADLs?: Yes Does the patient have difficulty dressing or bathing?: No Independently performs ADLs?: Yes (appropriate for developmental age) Does the patient have difficulty walking or climbing stairs?: Yes Weakness of Legs: Both Weakness of Arms/Hands: None  Permission Sought/Granted   Permission granted to share information with : Yes, Verbal Permission Granted  Share Information with NAME: wife           Emotional Assessment Appearance:: Appears stated age Attitude/Demeanor/Rapport: Engaged Affect (typically observed): Accepting Orientation: : Oriented to Self, Oriented to Place, Oriented to  Time, Oriented to Situation Alcohol / Substance Use: Not Applicable Psych Involvement: No (comment)  Admission diagnosis:  Status post total right knee replacement [Z96.651] Patient Active Problem List   Diagnosis Date Noted   Status post total right knee replacement 06/15/2021   Arthritis of right knee 06/14/2021   Status post total replacement of right hip 01/17/2021   Unilateral primary osteoarthritis, right hip 11/09/2020   PCP:  Stefanie Libel, MD Pharmacy:   [facility] - [place], [place] - [address] [place] [address] Phone: [phone] Fax: [phone]  [facility] [address] [place] [address] Phone: [phone] Fax: [phone]  [facility] - [place], [place] - [address] [place] [address] Phone: [phone] Fax: [phone]     Social Determinants of Health (SDOH) Interventions    Readmission Risk Interventions No flowsheet data found.

## 2021-06-15 NOTE — Anesthesia Postprocedure Evaluation (Signed)
Anesthesia Post Note  Patient: Restaurant manager, fast food  Procedure(s) Performed: RIGHT TOTAL KNEE ARTHROPLASTY (Right: Knee)     Patient location during evaluation: PACU Anesthesia Type: General Level of consciousness: sedated and awake Pain management: pain level controlled Vital Signs Assessment: post-procedure vital signs reviewed and stable Respiratory status: spontaneous breathing Cardiovascular status: stable Postop Assessment: no apparent nausea or vomiting Anesthetic complications: no   No notable events documented.  Last Vitals:  Vitals:   06/15/21 1023 06/15/21 1038  BP: (!) 175/89 (!) 160/81  Pulse: 62 (!) 58  Resp: 14 (!) 7  Temp:  37.1 C  SpO2: 99% 100%    Last Pain:  Vitals:   06/15/21 1038  TempSrc:   PainSc: Asleep        RLE Motor Response: Responds to commands (06/15/21 1038) RLE Sensation: Full sensation (06/15/21 1038)      Caren Macadam

## 2021-06-15 NOTE — Evaluation (Addendum)
Physical Therapy Evaluation Patient Details Name: Jordan [name] MRN: [id number] DOB: 03/04/1951 Today's Date: 06/15/2021  History of Present Illness  Pt is a 70 y/o male s/p R TKA on 11/3 . PMH includes previous R THA, low back fusion, arthritis, GERD, Hypertension, MI, and sleep apnea.  Clinical Impression  Received pt supine in bed with wife and daughter present at bedside. Pt performed bed mobility at min/mod A level overall and transferred with RW and mod A with mild R knee buckling into recliner. Educated pt/family on knee precautions and reviewed exercises. Pt eager to D/C home. Acute PT to follow.      Recommendations for follow up therapy are one component of a multi-disciplinary discharge planning process, led by the attending physician.  Recommendations may be updated based on patient status, additional functional criteria and insurance authorization.  Follow Up Recommendations Follow physician's recommendations for discharge plan and follow up therapies    Assistance Recommended at Discharge Intermittent Supervision/Assistance  Functional Status Assessment Patient has had a recent decline in their functional status and demonstrates the ability to make significant improvements in function in a reasonable and predictable amount of time.  Equipment Recommendations  Rolling walker (2 wheels)    Recommendations for Other Services       Precautions / Restrictions Precautions Precautions: Knee Precaution Booklet Issued: No Precaution Comments: no pillow under knee Required Braces or Orthoses: Knee Immobilizer - Right Restrictions Weight Bearing Restrictions: Yes RLE Weight Bearing: Weight bearing as tolerated      Mobility  Bed Mobility Overal bed mobility: Needs Assistance Bed Mobility: Rolling;Sidelying to Sit Rolling: Min guard Sidelying to sit: Mod assist       General bed mobility comments: cues to avoid Valsalva    Transfers Overall transfer level: Needs  assistance Equipment used: Rolling walker (2 wheels) Transfers: Sit to/from [employer] Sit to Stand: Mod assist Stand pivot transfers: Mod assist         General transfer comment: cues for hand placement when standing, to avoid valsalva, and for use of UEs on RW due to R knee buckling    Ambulation/Gait                Stairs            Wheelchair Mobility    Modified Rankin (Stroke Patients Only)       Balance Overall balance assessment: Needs assistance Sitting-balance support: Bilateral upper extremity supported;Feet supported Sitting balance-Leahy Scale: Fair Sitting balance - Comments: sitting EOB with close supervision   Standing balance support: Bilateral upper extremity supported;During functional activity (RW) Standing balance-Leahy Scale: Poor Standing balance comment: Pt with mild R knee buckling                             Pertinent Vitals/Pain Pain Assessment: 0-10 Pain Score: 6  Pain Location: Lateral aspect of R knee Pain Descriptors / Indicators: Operative site guarding Pain Intervention(s): Limited activity within patient's tolerance;Monitored during session;Premedicated before session;Repositioned;Ice applied;Utilized relaxation techniques    Home Living Family/patient expects to be discharged to:: Private residence Living Arrangements: Spouse/significant other Available Help at Discharge: Family;Available 24 hours/day Type of Home: Mobile home Home Access: Stairs to enter Entrance Stairs-Rails: None Entrance Stairs-Number of Steps: 3   Home Layout: One level Home Equipment: Agricultural consultant (2 wheels);Rollator (4 wheels);Cane - single point Additional Comments: Adjustable bed    Prior Function Prior Level of Function : Independent/Modified Independent  Mobility Comments: used cane for ambulation       Hand Dominance        Extremity/Trunk Assessment        Lower Extremity  Assessment Lower Extremity Assessment: RLE deficits/detail RLE Deficits / Details: grossly generalized to 3/5 - limited by pain and decreased ROM RLE: Unable to fully assess due to pain RLE Sensation: decreased light touch RLE Coordination: WNL    Cervical / Trunk Assessment Cervical / Trunk Assessment: Kyphotic (mild)  Communication   Communication: No difficulties  Cognition Arousal/Alertness: Awake/alert Behavior During Therapy: WFL for tasks assessed/performed Overall Cognitive Status: Within Functional Limits for tasks assessed                                          General Comments General comments (skin integrity, edema, etc.): increased edema RLE>LLE    Exercises Total Joint Exercises Ankle Circles/Pumps: AROM;Right;Left;15 reps;Seated Quad Sets: Right;10 reps;AROM;Seated Heel Slides: AROM;Seated;Right;Other (comment) (3 reps) Goniometric ROM: able to reach ~ 80-85 degrees R knee flexion   Assessment/Plan    PT Assessment Patient needs continued PT services  PT Problem List Decreased strength;Decreased mobility;Decreased range of motion;Decreased activity tolerance;Decreased balance;Decreased skin integrity;Impaired sensation;Pain       PT Treatment Interventions DME instruction;Therapeutic exercise;Wheelchair mobility training;Gait training;Balance training;Manual techniques;Stair training;Neuromuscular re-education;Modalities;Functional mobility training;Therapeutic activities;Patient/family education    PT Goals (Current goals can be found in the Care Plan section)  Acute Rehab PT Goals Patient Stated Goal: to return home PT Goal Formulation: With patient/family Time For Goal Achievement: 06/22/21 Potential to Achieve Goals: Good    Frequency 7X/week   Barriers to discharge Inaccessible home environment 3 STE with no handrails    Co-evaluation               AM-PAC PT "6 Clicks" Mobility  Outcome Measure Help needed turning from  your back to your side while in a flat bed without using bedrails?: A Lot Help needed moving from lying on your back to sitting on the side of a flat bed without using bedrails?: A Lot Help needed moving to and from a bed to a chair (including a wheelchair)?: A Lot Help needed standing up from a chair using your arms (e.g., wheelchair or bedside chair)?: A Lot Help needed to walk in hospital room?: Total Help needed climbing 3-5 steps with a railing? : Total 6 Click Score: 10    End of Session Equipment Utilized During Treatment: Gait belt Activity Tolerance: Patient tolerated treatment well;Patient limited by pain Patient left: in chair;with call bell/phone within reach;with family/visitor present Nurse Communication: Mobility status PT Visit Diagnosis: Unsteadiness on feet (R26.81);Other abnormalities of gait and mobility (R26.89);Muscle weakness (generalized) (M62.81);Pain Pain - Right/Left: Right Pain - part of body: Knee    Time: 1430-1457 PT Time Calculation (min) (ACUTE ONLY): 27 min   Charges:   PT Evaluation $PT Eval Low Complexity: 1 Low PT Treatments $Therapeutic Activity: 8-22 mins        Raechel Chute PT, DPT   Alfonso Patten 06/15/2021, 3:41 PM

## 2021-06-15 NOTE — Interval H&P Note (Signed)
History and Physical Interval Note: The patient understands that he is here for a right total knee replacement to treat his right knee arthritis.  There has been no acute or interval change in his medical status.  Please see recent H&P.  The risks and benefits of surgery been explained in detail and informed consent is obtained.  The right operative knee has been marked.  06/15/2021 7:10 AM  Jordan [name]  has presented today for surgery, with the diagnosis of osteoarthritis right knee.  The various methods of treatment have been discussed with the patient and family. After consideration of risks, benefits and other options for treatment, the patient has consented to  Procedure(s): RIGHT TOTAL KNEE ARTHROPLASTY (Right) as a surgical intervention.  The patient's history has been reviewed, patient examined, no change in status, stable for surgery.  I have reviewed the patient's chart and labs.  Questions were answered to the patient's satisfaction.     Kathryne Hitch

## 2021-06-15 NOTE — Discharge Instructions (Signed)

## 2021-06-15 NOTE — Op Note (Signed)
[name], [name] MEDICAL RECORD NO: [id number] ACCOUNT NO: [id number] DATE OF BIRTH: 01/18/1951 FACILITY: [facility] LOCATION: [facility] PHYSICIAN: Vanita Panda. Magnus Ivan, MD  Operative Report   DATE OF PROCEDURE: 06/15/2021  PREOPERATIVE DIAGNOSES:  Primary osteoarthritis and degenerative joint disease, right knee.  POSTOPERATIVE DIAGNOSES:  Primary osteoarthritis and degenerative joint disease, right knee.  PROCEDURE:  Right total knee arthroplasty.  IMPLANTS:  Stryker Triathlon press-fit knee system with size 5 femur, size 5 tibial tray, 13 mm fixed bearing polyethylene insert, size 35 patellar button, all press-fit.  SURGEON:  Vanita Panda. Magnus Ivan, MD  ASSISTANT:  Richardean Canal, PA-C  ANESTHESIA: 1.  Attempted spinal. 2.  General.  ANTIBIOTICS:  3 grams IV Ancef.  TOURNIQUET TIME:  Less than 1 hour.  COMPLICATIONS:  None.  INDICATIONS:  The patient is a 70 year old gentleman well known to me.  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  He has debilitating arthritis involving both his knees and his right knee hurts worse than his left.  His x-rays show complete end-stage  arthritis and the varus malalignment.  At this point, he has tried and failed all forms of conservative treatment and his pain is daily with his right knee and it is detrimentally affecting his mobility, his quality of life and his activities of daily  living to the point he does wish to proceed with a total knee arthroplasty and we have recommended this as well.  We talked in length and detail about the risk of acute blood loss anemia, nerve and vessel injury, fracture, infection, DVT, implant failure  and skin and soft tissue issues.  We talked about our goals being decrease pain, improve mobility and overall improve quality of life.  DESCRIPTION OF PROCEDURE:  After informed consent was obtained, appropriate right knee was marked and adductor canal block was obtained in the right lower extremity in the  holding room.  He was then brought to the operating room and sat up on the  operating table where spinal anesthesia was attempted.  It was not successful, so he was laid in supine position and general anesthesia was obtained. A nonsterile tourniquet was placed around his upper right thigh and his right thigh, knee, leg, ankle  and foot were prepped and draped with DuraPrep and sterile drapes including a sterile stockinette.  A timeout was called and he was identified correct patient, correct right knee.  We then used Esmarch to wrap out the leg and tourniquet was inflated to  300 mm pressure.  We then made a direct midline incision over the patella and carried this proximally and distally.  We dissected down the knee joint, carried out a medial parapatellar arthrotomy, finding a very large joint effusion and significant  osteophytes in all three compartments.  With the knee in a flexed position, we removed all osteophytes we could, that were marginal around the knee, removed remnants of ACL, PCL, medial and lateral meniscus.  We then used the extramedullary cutting guide  to take 9 mm off the high side, correcting for varus and valgus and neutral slope.  We made this cut without difficulty, and we decided to back it down 2 more millimeters.  We then used the intramedullary drill to enter the femoral canal through the  notch of the knee for a distal femoral cutting guide, setting this for a right knee at 5 degrees externally rotated. We made this cut for a 10 mm distal femoral cut, made that cut without difficulty.  We brought the knee  back down to full extension and  achieved full extension with a 9 mm extension block.  We then went back to the femur and put our femoral sizing guide based off the epicondylar axis.  Based off this, we chose a size 5 femur.  We put a 4-in-1 cutting block for a size 5 femur, made our  anterior and posterior cuts, followed by our chamfer cuts.  We then made our femoral box cut.   Attention was then turned back to the tibia.  We chose a size 5 tibial tray for coverage, setting the rotation off the tibial tubercle and the femur.  We made  our keel punch off of this for a press-fit knee system given the quality of his bone.  With a size 5 trial tibia, we trialed a size 5 right femur and a 9 mm fixed bearing polyethylene insert.  I did not like the stability of that, so we went all the way  up to 13 and that gave [place] the stability and range of motion that we wanted.  We then made our patellar cut and drilled three holes for a size 35 press-fit patellar button.  With trial instrumentation in the knee, I put him through range of motion. I was  pleased with range of motion and stability.  We then removed all trial instrumentation from the knee and irrigated the knee with normal saline solution using pulsatile lavage.  We dried the knee real well and then we placed our real Stryker press-fit  tibial tray size 5 followed by a real size 5 right press-fit femur.  We placed our real 13 mm thickness fixed bearing polyethylene insert and press fit our size 35 patellar button.  Again, we put him through several cycles of range of motion.  We were  pleased with range of motion and stability.  We then let the tourniquet down.  Hemostasis was obtained with electrocautery.  We closed the arthrotomy with interrupted #1 Vicryl suture followed by 0 Vicryl to close the deep tissue and 2-0 Vicryl to close  the subcutaneous tissue.  The skin was closed with staples.  Xeroform, well-padded sterile dressing was applied.  He was awakened, extubated, and taken to recovery room in stable condition, with all final counts being correct and no complications noted.   Of note, Rexene Edison, PA-C, did assist during the entire case and assistance was crucial for facilitating every aspect of this case.   SHW D: 06/15/2021 9:18:12 am T: 06/15/2021 10:13:00 am  JOB: [id number]/ [id number]

## 2021-06-15 NOTE — Anesthesia Procedure Notes (Signed)
Spinal  Patient location during procedure: OR Start time: 06/15/2021 7:50 AM End time: 06/15/2021 8:00 AM Reason for block: surgical anesthesia Staffing Performed: anesthesiologist  Anesthesiologist: Leilani Able, MD Preanesthetic Checklist Completed: patient identified, IV checked, site marked, risks and benefits discussed, surgical consent, monitors and equipment checked, pre-op evaluation and timeout performed Spinal Block Patient position: sitting Prep: DuraPrep and site prepped and draped Patient monitoring: continuous pulse ox and blood pressure Approach: midline Location: L3-4 Injection technique: single-shot Needle Needle type: Pencan  Needle gauge: 24 G Needle length: 10 cm Needle insertion depth: 6 cm Assessment Events: failed spinal

## 2021-06-15 NOTE — Anesthesia Procedure Notes (Signed)
Procedure Name: Intubation Date/Time: 06/15/2021 8:01 AM Performed by: Claris Che, CRNA Pre-anesthesia Checklist: Patient identified, Emergency Drugs available, Suction available, Patient being monitored and Timeout performed Patient Re-evaluated:Patient Re-evaluated prior to induction Oxygen Delivery Method: Circle system utilized Preoxygenation: Pre-oxygenation with 100% oxygen Induction Type: IV induction and Cricoid Pressure applied Ventilation: Mask ventilation with difficulty Laryngoscope Size: Mac and 4 Grade View: Grade II Tube type: Oral Tube size: 8.0 mm Number of attempts: 1 Airway Equipment and Method: Stylet Placement Confirmation: ETT inserted through vocal cords under direct vision, positive ETCO2 and breath sounds checked- equal and bilateral Secured at: 23 cm Tube secured with: Tape Dental Injury: Teeth and Oropharynx as per pre-operative assessment

## 2021-06-16 ENCOUNTER — Encounter (HOSPITAL_COMMUNITY): Payer: Self-pay | Admitting: Orthopaedic Surgery

## 2021-06-16 DIAGNOSIS — M1711 Unilateral primary osteoarthritis, right knee: Secondary | ICD-10-CM | POA: Diagnosis not present

## 2021-06-16 LAB — CBC
HCT: 39.1 % (ref 39.0–52.0)
Hemoglobin: 12.8 g/dL — ABNORMAL LOW (ref 13.0–17.0)
MCH: 28.6 pg (ref 26.0–34.0)
MCHC: 32.7 g/dL (ref 30.0–36.0)
MCV: 87.3 fL (ref 80.0–100.0)
Platelets: 262 10*3/uL (ref 150–400)
RBC: 4.48 MIL/uL (ref 4.22–5.81)
RDW: 13.7 % (ref 11.5–15.5)
WBC: 16.1 10*3/uL — ABNORMAL HIGH (ref 4.0–10.5)
nRBC: 0 % (ref 0.0–0.2)

## 2021-06-16 LAB — BASIC METABOLIC PANEL
Anion gap: 6 (ref 5–15)
BUN: 15 mg/dL (ref 8–23)
CO2: 26 mmol/L (ref 22–32)
Calcium: 9.2 mg/dL (ref 8.9–10.3)
Chloride: 101 mmol/L (ref 98–111)
Creatinine, Ser: 0.92 mg/dL (ref 0.61–1.24)
GFR, Estimated: >60 mL/min (ref >60)
Glucose, Bld: 129 mg/dL — ABNORMAL HIGH (ref 70–99)
Potassium: 4.4 mmol/L (ref 3.5–5.1)
Sodium: 133 mmol/L — ABNORMAL LOW (ref 135–145)

## 2021-06-16 MED ORDER — OXYCODONE HCL 5 MG PO TABS
5.0000 mg | ORAL_TABLET | ORAL | 0 refills | Status: DC | PRN
Start: 2021-06-16 — End: 2021-06-19

## 2021-06-16 MED ORDER — KETOROLAC TROMETHAMINE 15 MG/ML IJ SOLN
15.0000 mg | Freq: Four times a day (QID) | INTRAMUSCULAR | Status: DC
  Administered 2021-06-16: 15 mg via INTRAVENOUS
  Filled 2021-06-16: qty 1

## 2021-06-16 MED ORDER — CYCLOBENZAPRINE HCL 10 MG PO TABS
10.0000 mg | ORAL_TABLET | Freq: Three times a day (TID) | ORAL | 1 refills | Status: DC | PRN
Start: 2021-06-16 — End: 2022-05-30

## 2021-06-16 NOTE — Discharge Summary (Signed)
Patient ID: Jordan [name] MRN: [id number] DOB/AGE: 71-Apr-1952 71 y.o.  Admit date: 06/15/2021 Discharge date: 06/16/2021  Admission Diagnoses:  Principal Problem:   Arthritis of right knee Active Problems:   Status post total right knee replacement   Discharge Diagnoses:  Same  Past Medical History:  Diagnosis Date   Arthritis    Back, hand, elbows, knees, hips   GERD (gastroesophageal reflux disease)    Hypertension    Myocardial infarction [facility])    Sleep apnea     Surgeries: Procedure(s): RIGHT TOTAL KNEE ARTHROPLASTY on 06/15/2021   Consultants:   Discharged Condition: Improved  Hospital Course: Jordan [name] is an 70 y.o. male who was admitted 06/15/2021 for operative treatment ofArthritis of right knee. Patient has severe unremitting pain that affects sleep, daily activities, and work/hobbies. After pre-op clearance the patient was taken to the operating room on 06/15/2021 and underwent  Procedure(s): RIGHT TOTAL KNEE ARTHROPLASTY.    Patient was given perioperative antibiotics:  Anti-infectives (From admission, onward)    Start     Dose/Rate Route Frequency Ordered Stop   06/15/21 1400  ceFAZolin (ANCEF) IVPB 2g/100 mL premix        2 g 200 mL/hr over 30 Minutes Intravenous Every 6 hours 06/15/21 0936 06/15/21 2056   06/15/21 0600  ceFAZolin (ANCEF) IVPB 3g/100 mL premix        3 g 200 mL/hr over 30 Minutes Intravenous On call to O.R. 06/15/21 0545 06/15/21 0812   06/15/21 0550  ceFAZolin (ANCEF) 3-0.9 GM/100ML-% IVPB       Note to Pharmacy: Amanda Pea   : cabinet override      06/15/21 0550 06/15/21 0820        Patient was given sequential compression devices, early ambulation, and chemoprophylaxis to prevent DVT.  Patient benefited maximally from hospital stay and there were no complications.    Recent vital signs: Patient Vitals for the past 24 hrs:  BP Temp Temp src Pulse Resp SpO2  06/16/21 0933 (!) 181/87 97.7 F (36.5 C) Oral 76 18 96 %   06/16/21 0532 (!) 154/80 98.5 F (36.9 C) Oral 78 18 95 %  06/16/21 0054 (!) 160/90 -- -- 72 -- 97 %  06/15/21 2358 (!) 180/90 97.8 F (36.6 C) Oral 74 -- 94 %  06/15/21 2113 (!) 166/93 98.5 F (36.9 C) Oral 81 -- 95 %  06/15/21 1558 (!) 177/94 98.1 F (36.7 C) Oral 68 16 95 %  06/15/21 1240 (!) 155/85 -- -- 65 16 91 %  06/15/21 1234 (!) 155/85 -- -- -- -- --  06/15/21 1138 (!) 162/98 -- -- 64 12 97 %     Recent laboratory studies:  Recent Labs    06/16/21 0050  WBC 16.1*  HGB 12.8*  HCT 39.1  PLT 262  NA 133*  K 4.4  CL 101  CO2 26  BUN 15  CREATININE 0.92  GLUCOSE 129*  CALCIUM 9.2     Discharge Medications:   Allergies as of 06/16/2021       Reactions   Iodinated Diagnostic Agents Shortness Of Breath, Itching, Other (See Comments)   Burning sensation   Gabapentin Hives, Rash, Other (See Comments)   Burning sensation        Medication List     STOP taking these medications    HYDROcodone-ibuprofen 7.5-200 MG tablet Commonly known as: VICOPROFEN       TAKE these medications    amLODipine 10 MG tablet Commonly known as: NORVASC Take  10 mg by mouth daily.   aspirin 81 MG chewable tablet Chew 81 mg by mouth daily.   clopidogrel 75 MG tablet Commonly known as: PLAVIX Take 75 mg by mouth daily.   clotrimazole-betamethasone cream Commonly known as: LOTRISONE Apply 1 application topically daily as needed (psoriasis).   cyclobenzaprine 10 MG tablet Commonly known as: FLEXERIL Take 1 tablet (10 mg total) by mouth 3 (three) times daily as needed for muscle spasms. What changed: when to take this   doxycycline 100 MG tablet Commonly known as: VIBRA-TABS Take 1 tablet (100 mg total) by mouth 2 (two) times daily.   furosemide 20 MG tablet Commonly known as: LASIX Take 10 mg by mouth daily.   guaiFENesin 100 MG/5ML liquid Commonly known as: ROBITUSSIN Take 10 mLs by mouth every 4 (four) hours as needed for cough or to loosen phlegm.    losartan 100 MG tablet Commonly known as: COZAAR Take 100 mg by mouth daily.   metoprolol succinate 50 MG 24 hr tablet Commonly known as: TOPROL-XL Take 50 mg by mouth daily. Take with or immediately following a meal.   NON FORMULARY Pt uses a cpap nightly   omeprazole 20 MG capsule Commonly known as: PRILOSEC Take 20 mg by mouth 2 (two) times daily before a meal.   oxyCODONE 5 MG immediate release tablet Commonly known as: Oxy IR/ROXICODONE Take 1-2 tablets (5-10 mg total) by mouth every 4 (four) hours as needed for moderate pain (pain score 4-6).   phenylephrine 1 % nasal spray Commonly known as: NEO-SYNEPHRINE Place 1-2 drops into both nostrils every 6 (six) hours as needed for congestion.   pravastatin 20 MG tablet Commonly known as: PRAVACHOL Take 20 mg by mouth daily.   Sennosides 25 MG Tabs Take 25 mg by mouth daily as needed (constipation).   tamsulosin 0.4 MG Caps capsule Commonly known as: FLOMAX Take 0.4 mg by mouth daily after breakfast.               Durable Medical Equipment  (From admission, onward)           Start     Ordered   06/15/21 1206  DME Walker rolling  Once       Question Answer Comment  Walker: With 5 Inch Wheels   Patient needs a walker to treat with the following condition Status post total right knee replacement      06/15/21 1205            Diagnostic Studies: DG Knee Right Port  Result Date: 06/15/2021 CLINICAL DATA:  Right total knee replacement EXAM: PORTABLE RIGHT KNEE - 1-2 VIEW COMPARISON:  Radiograph 03/15/2021 FINDINGS: There is a total knee arthroplasty in normal alignment without evidence of loosening or periprosthetic fracture. Expected soft tissue changes. IMPRESSION: Total knee arthroplasty without evidence of immediate hardware complication. Electronically Signed   By: Caprice Renshaw M.D.   On: 06/15/2021 10:30    Disposition: Discharge disposition: 01-Home or Self Care          Follow-up  Information     [facility] Follow up.   Specialty: [facility] Contact information: [address] [place] [address] [phone]         Kathryne Hitch, MD Follow up in 2 week(s).   Specialty: Orthopedic Surgery Contact information: [address] [place] [address] [phone]                  Signed: Cristal Deer  Aretha Parrot 06/16/2021, 11:20 AM

## 2021-06-16 NOTE — Progress Notes (Signed)
Holman Basher to be D/C'd  per MD order.  Discussed with the patient and all questions fully answered.  VSS, Skin clean, dry and intact without evidence of skin break down, no evidence of skin tears noted.  IV catheter discontinued intact. Site without signs and symptoms of complications. Dressing and pressure applied.  An After Visit Summary was printed and given to the patient.  D/c education completed with patient/family including follow up instructions, medication list, d/c activities limitations if indicated, with other d/c instructions as indicated by MD - patient able to verbalize understanding, all questions fully answered.   Patient instructed to return to ED, call 911, or call MD for any changes in condition.   Patient to be escorted via WC, and D/C home via private auto.

## 2021-06-16 NOTE — Progress Notes (Signed)
Subjective: 1 Day Post-Op Procedure(s) (LRB): RIGHT TOTAL KNEE ARTHROPLASTY (Right) Patient reports pain as moderate.    Objective: Vital signs in last 24 hours: Temp:  [97.8 F (36.6 C)-98.7 F (37.1 C)] 98.5 F (36.9 C) (11/04 0532) Pulse Rate:  [58-81] 78 (11/04 0532) Resp:  [7-18] 18 (11/04 0532) BP: (154-182)/(80-104) 154/80 (11/04 0532) SpO2:  [91 %-100 %] 95 % (11/04 0532)  Intake/Output from previous day: 11/03 0701 - 11/04 0700 In: 2681.6 [P.O.:450; I.V.:2031.6; IV Piggyback:200] Out: 355 [Urine:350; Blood:5] Intake/Output this shift: Total I/O In: 784.7 [P.O.:210; I.V.:474.7; IV Piggyback:100] Out: 350 [Urine:350]  Recent Labs    06/16/21 0050  HGB 12.8*   Recent Labs    06/16/21 0050  WBC 16.1*  RBC 4.48  HCT 39.1  PLT 262   Recent Labs    06/16/21 0050  NA 133*  K 4.4  CL 101  CO2 26  BUN 15  CREATININE 0.92  GLUCOSE 129*  CALCIUM 9.2   No results for input(s): LABPT, INR in the last 72 hours.  Sensation intact distally Intact pulses distally Dorsiflexion/Plantar flexion intact Incision: dressing C/D/I No cellulitis present Compartment soft   Assessment/Plan: 1 Day Post-Op Procedure(s) (LRB): RIGHT TOTAL KNEE ARTHROPLASTY (Right) Up with therapy Discharge home with home health this afternoon if clears therapy.      Kathryne Hitch 06/16/2021, 6:49 AM

## 2021-06-16 NOTE — Progress Notes (Signed)
Physical Therapy Treatment Patient Details Name: Jordan [name] MRN: [id number] DOB: 04/12/1951 Today's Date: 06/16/2021   History of Present Illness Pt is a 70 y/o male s/p R TKA on 11/3 . PMH includes previous R THA, low back fusion, arthritis, GERD, Hypertension, MI, and sleep apnea.    PT Comments    Pt demos improved LE strength and activity tolerance today, able to amb in halls without difficulty.  Pt also practices stair negotiation and is able to safely complete task with min HHA as pt does not have rails at home.  Pt is educated on LE therex and demos understanding, including how to use strap to assist with ROM.  Safe to return home when medically cleared for D/C.   Recommendations for follow up therapy are one component of a multi-disciplinary discharge planning process, led by the attending physician.  Recommendations may be updated based on patient status, additional functional criteria and insurance authorization.  Follow Up Recommendations  Home health PT     Assistance Recommended at Discharge PRN  Equipment Recommendations  Rolling walker (2 wheels)    Recommendations for Other Services       Precautions / Restrictions Precautions Precautions: Knee Restrictions Weight Bearing Restrictions: Yes RLE Weight Bearing: Weight bearing as tolerated     Mobility  Bed Mobility                 Patient Response: Cooperative  Transfers Overall transfer level: Modified independent Equipment used: Rolling walker (2 wheels) Transfers: Sit to/from Stand Sit to Stand: Supervision           General transfer comment: Pt. performs sit > stand x 2 during treatment with supervision only.  Intermittent VCs for safety with hand and foot placement. Demos good eccentric control with activity.    Ambulation/Gait Ambulation/Gait assistance: Supervision Gait Distance (Feet): 100 Feet Assistive device: Rolling walker (2 wheels) Gait Pattern/deviations: Step-to pattern      General Gait Details: Amb in halls with slow cadence, step-to gait pattern.  Appropriate gait sequencing reviewed.  Demos good carryover and posture with activity.   Stairs Stairs: Yes Stairs assistance: Min assist Stair Management: No rails Number of Stairs: 3 General stair comments: Pt. negotiates 3 steps with HHA only due to pt. not having rails at home.  HH support discussed with family who demo understanding.   Wheelchair Mobility    Modified Rankin (Stroke Patients Only)       Balance Overall balance assessment: Modified Independent                                          Cognition Arousal/Alertness: Awake/alert Behavior During Therapy: WFL for tasks assessed/performed Overall Cognitive Status: Within Functional Limits for tasks assessed                                 General Comments: Pt. is sitting up in chair when PT arrives.  Agreeable to work with PT, family present in room.  Wants to D/C home today.        Exercises Total Joint Exercises Heel Slides: AAROM;Right;20 reps Hip ABduction/ADduction: AROM;Right;20 reps Straight Leg Raises: AAROM;Right;20 reps Long Arc Quad: AAROM;Right;20 reps Knee Flexion: AAROM Goniometric ROM: Flex: 84, Ext: -16 Marching in Standing: AROM;Right;20 reps;Seated    General Comments General comments (skin integrity, edema, etc.): Pt.  educated on use of strap to assist with therex, demos understanding.  Also discussed alternating positioning between flex/ext to improve ROM.      Pertinent Vitals/Pain Pain Assessment: 0-10 Pain Score: 3  Pain Location: R knee Pain Descriptors / Indicators: Aching;Guarding;Tender;Discomfort Pain Intervention(s): Monitored during session    Home Living                          Prior Function            PT Goals (current goals can now be found in the care plan section) Progress towards PT goals: Progressing toward goals    Frequency            PT Plan Current plan remains appropriate    Co-evaluation              AM-PAC PT "6 Clicks" Mobility   Outcome Measure  Help needed turning from your back to your side while in a flat bed without using bedrails?: None Help needed moving from lying on your back to sitting on the side of a flat bed without using bedrails?: A Little Help needed moving to and from a bed to a chair (including a wheelchair)?: A Little Help needed standing up from a chair using your arms (e.g., wheelchair or bedside chair)?: A Little Help needed to walk in hospital room?: A Little Help needed climbing 3-5 steps with a railing? : A Little 6 Click Score: 19    End of Session Equipment Utilized During Treatment: Gait belt Activity Tolerance: Patient tolerated treatment well Patient left: in chair;with nursing/sitter in room;with family/visitor present         Time: 0867-6195 PT Time Calculation (min) (ACUTE ONLY): 57 min  Charges:  $Gait Training: 23-37 mins $Therapeutic Exercise: 8-22 mins $Therapeutic Activity: 8-22 mins                     Jabez Molner A. Zigmond Trela, PT, DPT Acute Rehabilitation Services Office: [phone]    Nichlos Kunzler A [place] 06/16/2021, 10:57 AM

## 2021-06-19 ENCOUNTER — Telehealth: Payer: Self-pay

## 2021-06-19 ENCOUNTER — Other Ambulatory Visit: Payer: Self-pay | Admitting: Orthopaedic Surgery

## 2021-06-19 ENCOUNTER — Telehealth: Payer: Self-pay | Admitting: Orthopaedic Surgery

## 2021-06-19 MED ORDER — OXYCODONE HCL 5 MG PO TABS: 5.0000 mg | ORAL_TABLET | ORAL | 0 refills | Status: DC | PRN

## 2021-06-19 NOTE — Telephone Encounter (Signed)
Pt needs refill on oxycodone.  °

## 2021-06-19 NOTE — Telephone Encounter (Signed)
Patient aware the pharmacy will fill this but self pay

## 2021-06-19 NOTE — Telephone Encounter (Signed)
Pt called in stating that the pharm stated that they can't refill his medication until next week due to the way the rx was written.   Please advise pt said he only has 5 pills left.

## 2021-06-24 ENCOUNTER — Other Ambulatory Visit: Payer: Self-pay | Admitting: Physician Assistant

## 2021-06-24 MED ORDER — OXYCODONE HCL 5 MG PO TABS
5.0000 mg | ORAL_TABLET | Freq: Four times a day (QID) | ORAL | 0 refills | Status: DC | PRN
Start: 2021-06-24 — End: 2021-06-29

## 2021-06-29 ENCOUNTER — Ambulatory Visit (INDEPENDENT_AMBULATORY_CARE_PROVIDER_SITE_OTHER): Payer: Medicare Other | Admitting: Orthopaedic Surgery

## 2021-06-29 ENCOUNTER — Encounter: Payer: Self-pay | Admitting: Orthopaedic Surgery

## 2021-06-29 DIAGNOSIS — Z96651 Presence of right artificial knee joint: Secondary | ICD-10-CM

## 2021-06-29 MED ORDER — OXYCODONE HCL 5 MG PO TABS
5.0000 mg | ORAL_TABLET | Freq: Four times a day (QID) | ORAL | 0 refills | Status: DC | PRN
Start: 2021-06-29 — End: 2021-07-04

## 2021-06-29 MED ORDER — OXYCODONE HCL 5 MG PO TABS
5.0000 mg | ORAL_TABLET | Freq: Four times a day (QID) | ORAL | 0 refills | Status: DC | PRN
Start: 2021-06-29 — End: 2021-06-29

## 2021-06-29 NOTE — Progress Notes (Signed)
The patient is 2-week status post a right total knee arthroplasty.  He is 70 years old.  He has been having home therapy and they still plan to work with him but he understands we need to transition understand outpatient physical therapy.  His range of motion is -3 degrees of full extension to about 80 to 85 degrees flexion.  His incision looks good.  We remove the staples in place Steri-Strips.  His calf is soft.  He is on his daily aspirin and Plavix which he is on chronically.  He will aggressively work on bending his knee.  I will send in some more oxycodone.  I counseled about try to use it sparingly and we can certainly refill this again sometime next week.  All question concerns were answered addressed.  We will see him back in 4 weeks to see how he is doing overall from a clinical exam standpoint but no x-rays are needed.

## 2021-07-04 ENCOUNTER — Telehealth: Payer: Self-pay | Admitting: Orthopaedic Surgery

## 2021-07-04 ENCOUNTER — Other Ambulatory Visit: Payer: Self-pay | Admitting: Orthopaedic Surgery

## 2021-07-04 MED ORDER — OXYCODONE HCL 5 MG PO TABS: 5.0000 mg | ORAL_TABLET | Freq: Four times a day (QID) | ORAL | 0 refills | Status: DC | PRN

## 2021-07-04 NOTE — Telephone Encounter (Signed)
Pt called requesting a refill of pain medication. Please send to pharmacy on file. Pt phone number is [phone].

## 2021-07-26 ENCOUNTER — Other Ambulatory Visit: Payer: Self-pay

## 2021-07-26 ENCOUNTER — Encounter: Payer: Self-pay | Admitting: Orthopaedic Surgery

## 2021-07-26 ENCOUNTER — Ambulatory Visit (INDEPENDENT_AMBULATORY_CARE_PROVIDER_SITE_OTHER): Payer: PRIVATE HEALTH INSURANCE | Admitting: Orthopaedic Surgery

## 2021-07-26 DIAGNOSIS — Z96651 Presence of right artificial knee joint: Secondary | ICD-10-CM

## 2021-07-26 NOTE — Progress Notes (Signed)
The patient is now 6-week status post a right total knee arthroplasty.  He is an active 70 year old gentleman.  He reports that he is doing well and is not taking pain medicine.  He is increasing his range of motion and strength he states as well.  On exam he lacks full extension by maybe a degree with his right knee.  I can flex him to about 95 degrees.  The knee is swollen and warm but not red and this is appropriate on how it looks postoperative at 6 weeks.  He is satisfied overall.  His calf is soft.  He surprisingly has limited flexion on his native left knee.  He will continue to push himself through outpatient physical therapy.  I would like to see him back in 4 weeks for just a repeat exam and no x-rays are needed.

## 2021-08-23 ENCOUNTER — Other Ambulatory Visit: Payer: Self-pay

## 2021-08-23 ENCOUNTER — Encounter: Payer: Self-pay | Admitting: Orthopaedic Surgery

## 2021-08-23 ENCOUNTER — Ambulatory Visit (INDEPENDENT_AMBULATORY_CARE_PROVIDER_SITE_OTHER): Payer: PRIVATE HEALTH INSURANCE | Admitting: Orthopaedic Surgery

## 2021-08-23 DIAGNOSIS — Z96651 Presence of right artificial knee joint: Secondary | ICD-10-CM

## 2021-08-23 DIAGNOSIS — Z96641 Presence of right artificial hip joint: Secondary | ICD-10-CM

## 2021-08-23 MED ORDER — PREDNISONE 50 MG PO TABS: ORAL_TABLET | ORAL | 0 refills | Status: DC

## 2021-08-23 NOTE — Progress Notes (Signed)
The patient is just over 2 months out from his right knee replacement in about 6 months out from his right hip replacement.  Most of his pain is over the trochanteric area and IT band he reports he is doing well overall.  They have been working on strength and range of motion and he feels like he is getting there.  I did give him a note to give to the therapist to have him work on any modalities to help decrease his IT band and trochanteric pain.  That is where most of his pain is on my exam today.  His right knee is moving well as is his right hip.  He does have some swelling still to be expected in the right knee but overall he is looking better.  Hopefully as the swelling goes down his flexion will improve.  I have recommended Voltaren gel over the IT band in the trochanteric area at least twice daily.  I will send in 5 days of a steroid as well to see if this will help with inflammation since he is having trouble sleeping at night and laying on that hip.  I would like to see him back in 4 weeks to see how he is doing overall but no x-rays are needed.

## 2021-09-20 ENCOUNTER — Ambulatory Visit: Payer: PRIVATE HEALTH INSURANCE | Admitting: Orthopaedic Surgery

## 2021-09-25 ENCOUNTER — Encounter: Payer: Self-pay | Admitting: Orthopaedic Surgery

## 2021-09-25 ENCOUNTER — Ambulatory Visit (INDEPENDENT_AMBULATORY_CARE_PROVIDER_SITE_OTHER): Payer: PRIVATE HEALTH INSURANCE | Admitting: Orthopaedic Surgery

## 2021-09-25 ENCOUNTER — Other Ambulatory Visit: Payer: Self-pay

## 2021-09-25 DIAGNOSIS — Z96641 Presence of right artificial hip joint: Secondary | ICD-10-CM | POA: Diagnosis not present

## 2021-09-25 DIAGNOSIS — Z96651 Presence of right artificial knee joint: Secondary | ICD-10-CM

## 2021-09-25 NOTE — Progress Notes (Signed)
The patient is following up after having a right knee replacement this past November on November 3.  We replaced his right hip in June of last year.  He says he is getting there on terms of his strength and range of motion he still having some swelling and pain but overall he is making good progress.  He is an active 71 year old gentleman.  He has been dealing still with IT band pain on that right side.  Overall though he is satisfied.  On exam his right operative hip moves smoothly.  His right knee still has some swelling but expected and his range of motion is not full yet but is getting there.  It feels ligamentously stable.  At this point he will continue increase his activities as comfort allows.  We will see him back in June and at that visit we will have a standing low AP pelvis as well as a standing AP and lateral of his right operative knee.

## 2022-01-23 ENCOUNTER — Ambulatory Visit (INDEPENDENT_AMBULATORY_CARE_PROVIDER_SITE_OTHER): Payer: PRIVATE HEALTH INSURANCE

## 2022-01-23 ENCOUNTER — Encounter: Payer: Self-pay | Admitting: Orthopaedic Surgery

## 2022-01-23 ENCOUNTER — Ambulatory Visit (INDEPENDENT_AMBULATORY_CARE_PROVIDER_SITE_OTHER): Payer: PRIVATE HEALTH INSURANCE | Admitting: Orthopaedic Surgery

## 2022-01-23 DIAGNOSIS — Z96651 Presence of right artificial knee joint: Secondary | ICD-10-CM | POA: Diagnosis not present

## 2022-01-23 DIAGNOSIS — Z96641 Presence of right artificial hip joint: Secondary | ICD-10-CM

## 2022-01-23 NOTE — Progress Notes (Signed)
Office Visit Note   Patient: Jordan [name]           Date of Birth: November 10, 1950           MRN: [id number] Visit Date: 01/23/2022              Requested by: Stefanie Libel, MD [address] [place] ,  [place] [address] PCP: Stefanie Libel, MD   Assessment & Plan: Visit Diagnoses:  1. History of total right knee replacement   2. History of right hip replacement     Plan: We will have him follow-up with Dr.Cohen to evaluate his back as the source of his radicular symptoms down both thighs.  We will see him back in August tentatively set him up for a left total knee arthroplasty as he has known arthritis involving left knee and is having significant pain and wants to undergo total knee arthroplasty.  Questions were encouraged and answered by Dr. Magnus Ivan and myself  Follow-Up Instructions: Return in about 2 months (around 03/25/2022).   Orders:  Orders Placed This Encounter  Procedures   XR Knee 1-2 Views Right   XR Pelvis 1-2 Views   No orders of the defined types were placed in this encounter.     Procedures: No procedures performed   Clinical Data: No additional findings.   Subjective: Chief Complaint  Patient presents with   Right Knee - Follow-up   Right Hip - Follow-up    HPI Jordan [name] comes in today status post right total knee arthroplasty 06/15/2021 and right total hip arthroplasty 01/17/2021.  He states overall that his knee is doing good.  His right hip he has no groin pain.  He is complaining though of right lateral thigh numbness tingling.  He states he cannot lie on the right hip.  He is also having some left leg soreness and slight numbness lateral thigh.  He notes some back pain with prolonged standing or walking pain is better with sitting.  Describes the back pain as a muscle ache like that he cannot sleep in the bed due to the back pain.  He has had prior surgeries last 1 done by Dr. Noel Gerold.   Review of Systems  Musculoskeletal:  Positive for back pain.      Objective: Vital Signs: There were no vitals taken for this visit.  Physical Exam Constitutional:      Appearance: He is not ill-appearing or diaphoretic.  Pulmonary:     Effort: Pulmonary effort is normal.  Neurological:     Mental Status: He is alert and oriented to person, place, and time.  Psychiatric:        Mood and Affect: Mood normal.     Ortho Exam Right knee full range of motion.  No instability valgus varus stressing.  Slight patellofemoral clunking consistent with weak quad.  Delete surgical incisions well-healed.  No abnormal warmth erythema or effusion right knee calf supple nontender.  Right hip good range of motion without pain.  Bilateral hips no tenderness over the trochanteric region.  Tenderness left distal IT band.  Negative straight leg raise bilaterally. Specialty Comm ents:  No specialty comments available.  Imaging: XR Pelvis 1-2 Views  Result Date: 01/23/2022 AP pelvis: Bilateral hips well located.  Status post right total hip arthroplasty well-seated components.  No acute findings.  XR Knee 1-2 Views Right  Result Date: 01/23/2022 Right knee 2 views: No acute fracture.  Knee is well located.  Status post right total  knee arthroplasty well-seated components.    PMFS History: Patient Active Problem List   Diagnosis Date Noted   Status post total right knee replacement 06/15/2021   Arthritis of right knee 06/14/2021   Status post total replacement of right hip 01/17/2021   Unilateral primary osteoarthritis, right hip 11/09/2020   Past Medical History:  Diagnosis Date   Arthritis    Back, hand, elbows, knees, hips   GERD (gastroesophageal reflux disease)    Hypertension    Myocardial infarction [facility])    Sleep apnea     History reviewed. No pertinent family history.  Past Surgical History:  Procedure Laterality Date   BACK SURGERY     Lower back fusion   CORONARY STENT PLACEMENT  2014   EYE SURGERY     Bilateral cataract removal    SPINAL CORD STIMULATOR IMPLANT     TOTAL HIP ARTHROPLASTY Right 01/17/2021   Procedure: RIGHT TOTAL HIP ARTHROPLASTY ANTERIOR APPROACH;  Surgeon: Kathryne Hitch, MD;  Location: [facility] OR;  Service: Orthopedics;  Laterality: Right;  Spinal was attempted with patients consent.  Patient has spinal stimulator and unable to get access.   TOTAL KNEE ARTHROPLASTY Right 06/15/2021   Procedure: RIGHT TOTAL KNEE ARTHROPLASTY;  Surgeon: Kathryne Hitch, MD;  Location: [facility] OR;  Service: Orthopedics;  Laterality: Right;   Social History   Occupational History   Not on file  Tobacco Use   Smoking status: Never   Smokeless tobacco: Never  Vaping Use   Vaping Use: Never used  Substance and Sexual Activity   Alcohol use: Yes    Comment: Drinks no more than 2 cases of beer per year   Drug use: Never   Sexual activity: Not on file

## 2022-03-26 ENCOUNTER — Ambulatory Visit: Payer: PRIVATE HEALTH INSURANCE | Admitting: Orthopaedic Surgery

## 2022-03-28 ENCOUNTER — Ambulatory Visit (INDEPENDENT_AMBULATORY_CARE_PROVIDER_SITE_OTHER): Payer: PRIVATE HEALTH INSURANCE

## 2022-03-28 ENCOUNTER — Encounter: Payer: Self-pay | Admitting: Orthopaedic Surgery

## 2022-03-28 ENCOUNTER — Ambulatory Visit (INDEPENDENT_AMBULATORY_CARE_PROVIDER_SITE_OTHER): Payer: PRIVATE HEALTH INSURANCE | Admitting: Orthopaedic Surgery

## 2022-03-28 DIAGNOSIS — M1711 Unilateral primary osteoarthritis, right knee: Secondary | ICD-10-CM

## 2022-03-28 DIAGNOSIS — M25562 Pain in left knee: Secondary | ICD-10-CM

## 2022-03-28 DIAGNOSIS — G8929 Other chronic pain: Secondary | ICD-10-CM

## 2022-03-28 NOTE — Progress Notes (Signed)
Office Visit Note   Patient: Jordan [name]           Date of Birth: [birth year]           MRN: [id number] Visit Date: 03/28/2022              Requested by: Stefanie Libel, MD [address] [place] ,  [place] [address] PCP: Stefanie Libel, MD   Assessment & Plan: Visit Diagnoses:  1. Chronic pain of left knee   2. Unilateral primary osteoarthritis, right knee     Plan: Patient continues to have left knee pain despite conservative measures including cortisone injections and exercise.  Therefore given his tricompartmental arthritis recommend left total knee arthroplasty.  He would like to proceed with this in the late October or early November.  He understands risk benefits surgery as he is undergoing a right total knee arthroplasty in the past.  Risk benefits of surgery discussed with patient.  Risk include but are not limited to DVT/PE, wound healing problems, infection, prolonged pain worsening pain nerve vessel injury and blood loss.  He will need to stop his aspirin and Plavix 1 week prior to surgery.  Follow-Up Instructions: Return for 2 weeks postop.   Orders:  Orders Placed This Encounter  Procedures   XR Knee 1-2 Views Left   No orders of the defined types were placed in this encounter.     Procedures: No procedures performed   Clinical Data: No additional findings.   Subjective: Chief Complaint  Patient presents with   Right Knee - Follow-up    HPI [name] returns today status post right total knee arthroplasty 06/15/2021.  He states that the right knee is doing well.  Still having pain in his IT band area with numbness.  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  He was given 2 shots in the IT band area 3 weeks ago and has improved his pain about 50%.  He states that he is due to see his neurosurgeon next week or so and most likely will go to therapy.  He also notes he is having increased pain in the left knee.  He has  had no new injury to the knee.  Pain in the knee is worse with activities.  He is wanting to set up left total knee arthroplasty in the near future.  He is on aspirin and Plavix. Review of Systems Denies any fevers, chills, infections or chest pain.  Positive for chronic shortness of breath.  Objective: Vital Signs: There were no vitals taken for this visit.  Physical Exam Constitutional:      Appearance: He is not ill-appearing or diaphoretic.  Pulmonary:     Effort: Pulmonary effort is normal.  Neurological:     Mental Status: He is alert and oriented to person, place, and time.  Psychiatric:        Mood and Affect: Mood normal.     Ortho Exam Right knee excellent range of motion without pain no instability with valgus varus stressing.  Surgical incisions well-healed.  Calf supple nontender.  Left knee good range of motion with pain.  Tenderness along medial joint line.  No gross instability valgus varus stressing.  Significant patellofemoral crepitus with range of motion left knee. Specialty Comments:  No specialty comments available.  Imaging: XR Knee 1-2 Views Left  Result Date: 03/28/2022 Left knee lateral view and review of AP view from last office  visit in June: No acute fractures.  Knee is well located.  Tricompartmental arthritis with bone-on-bone medial compartment moderate lateral compartmental changes with periarticular spurring.  Moderate patellofemoral changes.  Normal bony density.    PMFS History: Patient Active Problem List   Diagnosis Date Noted   Status post total right knee replacement 06/15/2021   Arthritis of right knee 06/14/2021   Status post total replacement of right hip 01/17/2021   Unilateral primary osteoarthritis, right hip 11/09/2020   Past Medical History:  Diagnosis Date   Arthritis    Back, hand, elbows, knees, hips   GERD (gastroesophageal reflux disease)    Hypertension    Myocardial infarction [facility])    Sleep apnea     History  reviewed. No pertinent family history.  Past Surgical History:  Procedure Laterality Date   BACK SURGERY     Lower back fusion   CORONARY STENT PLACEMENT  2014   EYE SURGERY     Bilateral cataract removal   SPINAL CORD STIMULATOR IMPLANT     TOTAL HIP ARTHROPLASTY Right 01/17/2021   Procedure: RIGHT TOTAL HIP ARTHROPLASTY ANTERIOR APPROACH;  Surgeon: Kathryne Hitch, MD;  Location: [facility] OR;  Service: Orthopedics;  Laterality: Right;  Spinal was attempted with patients consent.  Patient has spinal stimulator and unable to get access.   TOTAL KNEE ARTHROPLASTY Right 06/15/2021   Procedure: RIGHT TOTAL KNEE ARTHROPLASTY;  Surgeon: Kathryne Hitch, MD;  Location: [facility] OR;  Service: Orthopedics;  Laterality: Right;   Social History   Occupational History   Not on file  Tobacco Use   Smoking status: Never   Smokeless tobacco: Never  Vaping Use   Vaping Use: Never used  Substance and Sexual Activity   Alcohol use: Yes    Comment: Drinks no more than 2 cases of beer per year   Drug use: Never   Sexual activity: Not on file

## 2022-04-18 ENCOUNTER — Other Ambulatory Visit: Payer: Self-pay

## 2022-05-31 ENCOUNTER — Other Ambulatory Visit: Payer: Self-pay | Admitting: Physician Assistant

## 2022-06-04 ENCOUNTER — Other Ambulatory Visit: Payer: Self-pay | Admitting: Physician Assistant

## 2022-06-04 NOTE — Progress Notes (Signed)
Surgical Instructions    Your procedure is scheduled on Tuesday, 06/12/22.  Report to [facility] Main Entrance "A" at 7:30 A.M., then check in with the Admitting office.  Call this number if you have problems the morning of surgery:  [phone]   If you have any questions prior to your surgery date call [phone]: Open Monday-Friday 8am-4pm If you experience any cold or flu symptoms such as cough, fever, chills, shortness of breath, etc. between now and your scheduled surgery, please notify us at the above number     Remember:  Do not eat after midnight the night before your surgery  You may drink clear liquids until 6:30am the morning of your surgery.   Clear liquids allowed are: Water, Non-Citrus Juices (without pulp), Carbonated Beverages, Clear Tea, Black Coffee ONLY (NO MILK, CREAM OR POWDERED CREAMER of any kind), and Gatorade  Patient Instructions  The night before surgery:  No food after midnight. ONLY clear liquids after midnight  The day of surgery (if you do NOT have diabetes):  Drink ONE (1) Pre-Surgery Clear Ensure by 6:30am the morning of surgery. Drink in one sitting. Do not sip.  This drink was given to you during your hospital  pre-op appointment visit. Nothing else to drink after completing the  Pre-Surgery Clear Ensure.          If you have questions, please contact your surgeon's office.     Take these medicines the morning of surgery with A SIP OF WATER:  hydrALAZINE (APRESOLINE)  metoprolol succinate (TOPROL-XL)  IF NEEDED: fluticasone (FLONASE)  As of today, STOP taking any Aspirin (unless otherwise instructed by your surgeon) Aleve, Naproxen, Ibuprofen, Motrin, Advil, Goody's, BC's, all herbal medications, fish oil, and all vitamins.  Please stop clopidogrel (PLAVIX) 1 week prior to surgery. Your last dose will be 06/04/22.          Do not wear jewelry or makeup. Do not wear lotions, powders, cologne or deodorant. Men may shave face and  neck. Do not bring valuables to the hospital. Do not wear nail polish, gel polish, artificial nails, or any other type of covering on natural nails (fingers and toes) If you have artificial nails or gel coating that need to be removed by a nail salon, please have this removed prior to surgery. Artificial nails or gel coating may interfere with anesthesia's ability to adequately monitor your vital signs.  [facility] is not responsible for any belongings or valuables.    Do NOT Smoke (Tobacco/Vaping)  24 hours prior to your procedure  If you use a CPAP at night, you may bring your mask for your overnight stay.   Contacts, glasses, hearing aids, dentures or partials may not be worn into surgery, please bring cases for these belongings   For patients admitted to the hospital, discharge time will be determined by your treatment team.   Patients discharged the day of surgery will not be allowed to drive home, and someone needs to stay with them for 24 hours.   SURGICAL WAITING ROOM VISITATION Patients having surgery or a procedure may have no more than 2 support people in the waiting area - these visitors may rotate.   Children under the age of 54 must have an adult with them who is not the patient. If the patient needs to stay at the hospital during part of their recovery, the visitor guidelines for inpatient rooms apply. Pre-op nurse will coordinate an appropriate time for 1 support person to accompany patient  in pre-op.  This support person may not rotate.   Please refer to [email or web address] for the visitor guidelines for Inpatients (after your surgery is over and you are in a regular room).    Special instructions:    Oral Hygiene is also important to reduce your risk of infection.  Remember - BRUSH YOUR TEETH THE MORNING OF SURGERY WITH YOUR REGULAR TOOTHPASTE   - Preparing For Surgery  Before surgery, you can play an  important role. Because skin is not sterile, your skin needs to be as free of germs as possible. You can reduce the number of germs on your skin by washing with CHG (chlorahexidine gluconate) Soap before surgery.  CHG is an antiseptic cleaner which kills germs and bonds with the skin to continue killing germs even after washing.     Please do not use if you have an allergy to CHG or antibacterial soaps. If your skin becomes reddened/irritated stop using the CHG.  Do not shave (including legs and underarms) for at least 48 hours prior to first CHG shower. It is OK to shave your face.  Please follow these instructions carefully.     Shower the NIGHT BEFORE SURGERY and the MORNING OF SURGERY with CHG Soap.   If you chose to wash your hair, wash your hair first as usual with your normal shampoo. After you shampoo, rinse your hair and body thoroughly to remove the shampoo.  Then [employer] and genitals (private parts) with your normal soap and rinse thoroughly to remove soap.  After that Use CHG Soap as you would any other liquid soap. You can apply CHG directly to the skin and wash gently with a scrungie or a clean washcloth.   Apply the CHG Soap to your body ONLY FROM THE NECK DOWN.  Do not use on open wounds or open sores. Avoid contact with your eyes, ears, mouth and genitals (private parts). Wash Face and genitals (private parts)  with your normal soap.   Wash thoroughly, paying special attention to the area where your surgery will be performed.  Thoroughly rinse your body with warm water from the neck down.  DO NOT shower/wash with your normal soap after using and rinsing off the CHG Soap.  Pat yourself dry with a CLEAN TOWEL.  Wear CLEAN PAJAMAS to bed the night before surgery  Place CLEAN SHEETS on your bed the night before your surgery  DO NOT SLEEP WITH PETS.   Day of Surgery: Take a shower with CHG soap. Wear Clean/Comfortable clothing the morning of surgery Do not apply any  deodorants/lotions.   Remember to brush your teeth WITH YOUR REGULAR TOOTHPASTE.    If you received a COVID test during your pre-op visit, it is requested that you wear a mask when out in public, stay away from anyone that may not be feeling well, and notify your surgeon if you develop symptoms. If you have been in contact with anyone that has tested positive in the last 10 days, please notify your surgeon.    Please read over the following fact sheets that you were given.

## 2022-06-05 ENCOUNTER — Encounter (HOSPITAL_COMMUNITY): Payer: Self-pay

## 2022-06-05 ENCOUNTER — Other Ambulatory Visit: Payer: Self-pay

## 2022-06-05 ENCOUNTER — Encounter (HOSPITAL_COMMUNITY)
Admission: RE | Admit: 2022-06-05 | Discharge: 2022-06-05 | Disposition: A | Discharge status: Home / Self Care | Payer: Medicare Other | Source: Ambulatory Visit | Attending: Orthopaedic Surgery | Admitting: Orthopaedic Surgery

## 2022-06-05 VITALS — BP 136/59 | HR 76 | Temp 97.4°F | Resp 18 | Ht 72.0 in | Wt 273.9 lb

## 2022-06-05 DIAGNOSIS — I2510 Atherosclerotic heart disease of native coronary artery without angina pectoris: Secondary | ICD-10-CM | POA: Insufficient documentation

## 2022-06-05 DIAGNOSIS — Z01818 Encounter for other preprocedural examination: Secondary | ICD-10-CM | POA: Insufficient documentation

## 2022-06-05 HISTORY — DX: Personal history of other diseases of the digestive system: Z87.19

## 2022-06-05 LAB — BASIC METABOLIC PANEL
Anion gap: 6 (ref 5–15)
BUN: 20 mg/dL (ref 8–23)
CO2: 26 mmol/L (ref 22–32)
Calcium: 9.3 mg/dL (ref 8.9–10.3)
Chloride: 105 mmol/L (ref 98–111)
Creatinine, Ser: 1.07 mg/dL (ref 0.61–1.24)
GFR, Estimated: >60 mL/min (ref >60)
Glucose, Bld: 101 mg/dL — ABNORMAL HIGH (ref 70–99)
Potassium: 4 mmol/L (ref 3.5–5.1)
Sodium: 137 mmol/L (ref 135–145)

## 2022-06-05 LAB — CBC
HCT: 43.9 % (ref 39.0–52.0)
Hemoglobin: 14.3 g/dL (ref 13.0–17.0)
MCH: 30.2 pg (ref 26.0–34.0)
MCHC: 32.6 g/dL (ref 30.0–36.0)
MCV: 92.6 fL (ref 80.0–100.0)
Platelets: 256 10*3/uL (ref 150–400)
RBC: 4.74 MIL/uL (ref 4.22–5.81)
RDW: 13.4 % (ref 11.5–15.5)
WBC: 7.9 10*3/uL (ref 4.0–10.5)
nRBC: 0 % (ref 0.0–0.2)

## 2022-06-05 LAB — SURGICAL PCR SCREEN
MRSA, PCR: NEGATIVE
Staphylococcus aureus: NEGATIVE

## 2022-06-05 NOTE — Progress Notes (Signed)
PCP - Ted Velie Cardiologist - Wahid, Asif at [facility]  PPM/ICD - denies   Chest x-ray -  EKG - 06/05/22 Stress Test - 10/02/21-CE ECHO - 09/28/21-CE Cardiac Cath - 04/22/14-CE  Sleep Study - +OSA CPAP - does not wear cpap  Follow your surgeon's instructions on when to stop Aspirin.  If no instructions were given by your surgeon then you will need to call the office to get those instructions.     ERAS Protcol -yes PRE-SURGERY Ensure or G2- ensure ordered and given  COVID TEST- not needed   Anesthesia review: yes, Karoline Caldwell came to talk to patient at PAT appt. Recent cardiac testing and no new symptoms/concerns.   Patient denies shortness of breath, fever, cough and chest pain at PAT appointment   All instructions explained to the patient, with a verbal understanding of the material. Patient agrees to go over the instructions while at home for a better understanding. Patient also instructed to self quarantine after being tested for COVID-19. The opportunity to ask questions was provided.

## 2022-06-06 NOTE — Anesthesia Preprocedure Evaluation (Addendum)
Anesthesia Evaluation  Patient identified by MRN, date of birth, ID band Patient awake    Reviewed: Allergy & Precautions, H&P , NPO status , Patient's Chart, lab work & pertinent test results  Airway Mallampati: II   Neck ROM: full    Dental   Pulmonary sleep apnea ,    breath sounds clear to auscultation       Cardiovascular hypertension, + CAD, + Past MI and + Cardiac Stents   Rhythm:regular Rate:Normal     Neuro/Psych    GI/Hepatic hiatal hernia, GERD  ,  Endo/Other    Renal/GU      Musculoskeletal   Abdominal   Peds  Hematology   Anesthesia Other Findings   Reproductive/Obstetrics                            Anesthesia Physical Anesthesia Plan  ASA: 3  Anesthesia Plan: General   Post-op Pain Management: Regional block*   Induction: Intravenous  PONV Risk Score and Plan: 2 and Ondansetron, Dexamethasone, Midazolam and Treatment may vary due to age or medical condition  Airway Management Planned: Oral ETT  Additional Equipment:   Intra-op Plan:   Post-operative Plan: Extubation in OR  Informed Consent: I have reviewed the patients History and Physical, chart, labs and discussed the procedure including the risks, benefits and alternatives for the proposed anesthesia with the patient or authorized representative who has indicated his/her understanding and acceptance.     Dental advisory given  Plan Discussed with: CRNA, Anesthesiologist and Surgeon  Anesthesia Plan Comments: (PAT note by Antionette Poles, PA-C:  Follows with cardiology at [facility] for history of HTN, dyslipidemia, CAD(NSTEMI, s/p DES x2 LAD 09/30/13), OSA on CPAP.  Last seen by Dr. Willeen Cass 09/13/2021.  He was noted to be doing well at that time, no changes to management.  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  His recommended follow-up in approximately 9 months.  Nuclear stress 10/02/2021 was low risk, nonischemic.   Echo 09/28/2021 showed EF 60 to 65%, moderate LVH, normal wall motion, normal valves.  At PAT appointment patient stated he can go up 10 stairs without chest pain or significant shortness of breath.  He denies any changes since last being seen by Dr. Willeen Cass, states he feels well from cardiopulmonary standpoint.  History of L1 to pelvis fusion as well as spinal cord stimulator in place.  Anesthesia procedure notes from right TKA 06/15/2021 indicate the patient had failed spinal anesthesia at that time.  Patient also reports he had severe postoperative pain in PACU.  He states he is a fast metabolizer of local anesthetics like lidocaine has previously not had great pain control with regional anesthesia.  He reports he has similar difficulty with dental procedures due to fast metabolism of local anesthetic.  History of hiatal hernia.  Patient reports he stopped his Plavix 05/31/2022.  Preop labs reviewed, WNL.  EKG 06/05/22: NSR. Non-specific intra-ventricular conduction delay. Rate 73.  Nuclear stress 10/02/2021 (Care Everywhere): Findings:  The overall quality of the study is good. Left ventricular cavity is normal and LVH not seen. There is no significant motion artifact noted.  TID ratio : 0.75  ED volume : 144  ES volume:51  LV ejection fraction : 65%  BMI: 36.27   EKG shows : No ischemia noted   Perfusion images showed:  Normal perfusion throughout the myocardium. Post stress gated study demonstrates an normal post stress ejection fraction of 65% with normal  wall motion and normal wall thickening   Impression:   1. This is a normal perfusion study with no evidence of ischemia. Normal wall motion and wall thickening noted  2.The above results will be discussed with the patient and will decide next step   TTE 09/28/2021 (Care Everywhere): LeftVentricle: The calculated left ventricular ejection fraction is  62%.  . LeftVentricle: There is moderate hypertrophy.  .  LeftVentricle: Systolic function is normal. EF: 60-65%.  . LeftVentricle: Wall motion is normal.  . LeftVentricle: Doppler parameters consistent with mild diastolic  dysfunction and low to normal LA pressure.  . RightVentricle: Right ventricle is mildly dilated.  . Pericardium: The pericardium has a fat pad.  )       Anesthesia Quick Evaluation

## 2022-06-06 NOTE — Progress Notes (Addendum)
Anesthesia Chart Review:  Follows with cardiology at [facility] for history of HTN, dyslipidemia, CAD (NSTEMI, s/p DES x2 LAD 09/30/13), OSA on CPAP.  Last seen by Dr. Gerarda Gunther 09/13/2021.  He was noted to be doing well at that time, no changes to management.  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  His recommended follow-up in approximately 9 months.  Nuclear stress 10/02/2021 was low risk, nonischemic.  Echo 09/28/2021 showed EF 60 to 65%, moderate LVH, normal wall motion, normal valves.  At PAT appointment patient stated he can go up 10 stairs without chest pain or significant shortness of breath.  He denies any changes since last being seen by Dr. Gerarda Gunther, states he feels well from cardiopulmonary standpoint.  History of L1 to pelvis fusion as well as spinal cord stimulator in place.  Anesthesia procedure notes from right TKA 06/15/2021 indicate the patient had failed spinal anesthesia at that time.  Patient also reports he had severe postoperative pain in PACU.  He states he is a fast metabolizer of local anesthetics like lidocaine has previously not had great pain control with regional anesthesia.  He reports he has similar difficulty with dental procedures due to fast metabolism of local anesthetic.  History of hiatal hernia.  Patient reports he stopped his Plavix 05/31/2022.  Preop labs reviewed, WNL.  EKG 06/05/22: NSR. Non-specific intra-ventricular conduction delay. Rate 73.  Nuclear stress 10/02/2021 (Care Everywhere): Findings:  The overall quality of the study is good.  Left ventricular cavity is normal and LVH not seen.  There is no significant motion artifact noted.  TID ratio : 0.75  ED volume : 144  ES volume:51  LV ejection fraction : 65%  BMI: 36.27   EKG shows : No ischemia noted   Perfusion images showed:  Normal perfusion throughout the myocardium. Post stress gated study demonstrates an normal post stress ejection fraction of 65% with normal wall motion and normal wall  thickening   Impression:    1. This is a normal perfusion study with no evidence of ischemia.  Normal wall motion and wall thickening noted   2.The above results will be discussed with the patient and will decide next step   TTE 09/28/2021 (Care Everywhere): Left Ventricle: The calculated left ventricular ejection fraction is  62%.    Left Ventricle: There is moderate hypertrophy.    Left Ventricle: Systolic function is normal. EF: 60-65%.    Left Ventricle: Wall motion is normal.    Left Ventricle: Doppler parameters consistent with mild diastolic  dysfunction and low to normal LA pressure.    Right Ventricle: Right ventricle is mildly dilated.    Pericardium: The pericardium has a fat pad.     Wynonia Musty [facility] Short Stay Center/Anesthesiology Phone [phone] 06/06/2022 11:15 AM

## 2022-06-12 ENCOUNTER — Observation Stay (HOSPITAL_COMMUNITY): Payer: Medicare Other

## 2022-06-12 ENCOUNTER — Ambulatory Visit (HOSPITAL_BASED_OUTPATIENT_CLINIC_OR_DEPARTMENT_OTHER): Payer: Medicare Other | Admitting: Certified Registered Nurse Anesthetist

## 2022-06-12 ENCOUNTER — Ambulatory Visit (HOSPITAL_COMMUNITY): Payer: Medicare Other | Admitting: Physician Assistant

## 2022-06-12 ENCOUNTER — Encounter (HOSPITAL_COMMUNITY): Payer: Self-pay | Admitting: Orthopaedic Surgery

## 2022-06-12 ENCOUNTER — Observation Stay (HOSPITAL_COMMUNITY)
Admission: RE | Admit: 2022-06-12 | Discharge: 2022-06-13 | Disposition: A | Discharge status: Home Health | Payer: Medicare Other | Attending: Orthopaedic Surgery | Admitting: Orthopaedic Surgery

## 2022-06-12 ENCOUNTER — Other Ambulatory Visit: Payer: Self-pay

## 2022-06-12 ENCOUNTER — Encounter (HOSPITAL_COMMUNITY)
Admission: RE | Disposition: A | Discharge status: Home Health | Payer: Self-pay | Source: Home / Self Care | Attending: Orthopaedic Surgery

## 2022-06-12 DIAGNOSIS — I10 Essential (primary) hypertension: Secondary | ICD-10-CM | POA: Diagnosis not present

## 2022-06-12 DIAGNOSIS — Z96652 Presence of left artificial knee joint: Secondary | ICD-10-CM

## 2022-06-12 DIAGNOSIS — M1712 Unilateral primary osteoarthritis, left knee: Principal | ICD-10-CM

## 2022-06-12 DIAGNOSIS — Z96651 Presence of right artificial knee joint: Secondary | ICD-10-CM | POA: Insufficient documentation

## 2022-06-12 DIAGNOSIS — Z96641 Presence of right artificial hip joint: Secondary | ICD-10-CM | POA: Insufficient documentation

## 2022-06-12 DIAGNOSIS — I2510 Atherosclerotic heart disease of native coronary artery without angina pectoris: Secondary | ICD-10-CM | POA: Diagnosis not present

## 2022-06-12 DIAGNOSIS — I252 Old myocardial infarction: Secondary | ICD-10-CM | POA: Diagnosis not present

## 2022-06-12 HISTORY — PX: TOTAL KNEE ARTHROPLASTY: SHX125

## 2022-06-12 HISTORY — DX: Unilateral primary osteoarthritis, left knee: M17.12

## 2022-06-12 SURGERY — ARTHROPLASTY, KNEE, TOTAL
Anesthesia: General | Site: Knee | Laterality: Left

## 2022-06-12 MED ORDER — PHENOL 1.4 % MT LIQD: 1.0000 | OROMUCOSAL | Status: DC | PRN

## 2022-06-12 MED ORDER — CHLORTHALIDONE 25 MG PO TABS
25.0000 mg | ORAL_TABLET | Freq: Every evening | ORAL | Status: DC
  Administered 2022-06-12: 25 mg via ORAL
  Filled 2022-06-12 (×2): qty 1

## 2022-06-12 MED ORDER — OXYCODONE HCL 5 MG/5ML PO SOLN: 5.0000 mg | Freq: Once | ORAL | Status: AC | PRN

## 2022-06-12 MED ORDER — SODIUM CHLORIDE 0.9 % IR SOLN
Status: DC | PRN
  Administered 2022-06-12: 1000 mL

## 2022-06-12 MED ORDER — METHOCARBAMOL 500 MG PO TABS
500.0000 mg | ORAL_TABLET | Freq: Four times a day (QID) | ORAL | Status: DC | PRN
  Administered 2022-06-12 – 2022-06-13 (×2): 500 mg via ORAL
  Filled 2022-06-12 (×2): qty 1

## 2022-06-12 MED ORDER — LIDOCAINE 2% (20 MG/ML) 5 ML SYRINGE
INTRAMUSCULAR | Status: DC | PRN
  Administered 2022-06-12: 60 mg via INTRAVENOUS

## 2022-06-12 MED ORDER — MIDAZOLAM HCL 2 MG/2ML IJ SOLN
INTRAMUSCULAR | Status: AC
  Administered 2022-06-12: 1 mg via INTRAVENOUS
  Filled 2022-06-12: qty 2

## 2022-06-12 MED ORDER — DEXMEDETOMIDINE HCL IN NACL 80 MCG/20ML IV SOLN
INTRAVENOUS | Status: DC | PRN
  Administered 2022-06-12 (×2): 10 ug via BUCCAL
  Administered 2022-06-12: 20 ug via BUCCAL

## 2022-06-12 MED ORDER — HYDROMORPHONE HCL 1 MG/ML IJ SOLN
INTRAMUSCULAR | Status: AC
  Filled 2022-06-12: qty 1

## 2022-06-12 MED ORDER — HYDROMORPHONE HCL 2 MG PO TABS: 2.0000 mg | ORAL_TABLET | ORAL | Status: DC | PRN

## 2022-06-12 MED ORDER — FENTANYL CITRATE (PF) 250 MCG/5ML IJ SOLN
INTRAMUSCULAR | Status: AC
  Filled 2022-06-12: qty 5

## 2022-06-12 MED ORDER — LIDOCAINE 2% (20 MG/ML) 5 ML SYRINGE
INTRAMUSCULAR | Status: AC
  Filled 2022-06-12: qty 5

## 2022-06-12 MED ORDER — FENTANYL CITRATE (PF) 100 MCG/2ML IJ SOLN
INTRAMUSCULAR | Status: AC
  Administered 2022-06-12: 50 ug via INTRAVENOUS
  Filled 2022-06-12: qty 2

## 2022-06-12 MED ORDER — PANTOPRAZOLE SODIUM 40 MG PO TBEC
40.0000 mg | DELAYED_RELEASE_TABLET | Freq: Every day | ORAL | Status: DC
  Administered 2022-06-12 – 2022-06-13 (×2): 40 mg via ORAL
  Filled 2022-06-12 (×2): qty 1

## 2022-06-12 MED ORDER — ROCURONIUM BROMIDE 10 MG/ML (PF) SYRINGE
PREFILLED_SYRINGE | INTRAVENOUS | Status: AC
  Filled 2022-06-12: qty 10

## 2022-06-12 MED ORDER — ORAL CARE MOUTH RINSE: 15.0000 mL | Freq: Once | OROMUCOSAL | Status: AC

## 2022-06-12 MED ORDER — METOPROLOL SUCCINATE ER 50 MG PO TB24
50.0000 mg | ORAL_TABLET | Freq: Every morning | ORAL | Status: DC
  Administered 2022-06-13: 50 mg via ORAL
  Filled 2022-06-12: qty 1

## 2022-06-12 MED ORDER — ACETAMINOPHEN 325 MG PO TABS: 325.0000 mg | ORAL_TABLET | Freq: Four times a day (QID) | ORAL | Status: DC | PRN

## 2022-06-12 MED ORDER — DOCUSATE SODIUM 100 MG PO CAPS
100.0000 mg | ORAL_CAPSULE | Freq: Two times a day (BID) | ORAL | Status: DC
  Administered 2022-06-12 – 2022-06-13 (×2): 100 mg via ORAL
  Filled 2022-06-12 (×2): qty 1

## 2022-06-12 MED ORDER — DEXAMETHASONE SODIUM PHOSPHATE 10 MG/ML IJ SOLN
INTRAMUSCULAR | Status: AC
  Filled 2022-06-12: qty 1

## 2022-06-12 MED ORDER — SUGAMMADEX SODIUM 500 MG/5ML IV SOLN
INTRAVENOUS | Status: AC
  Filled 2022-06-12: qty 5

## 2022-06-12 MED ORDER — HYDROMORPHONE HCL 1 MG/ML IJ SOLN
0.2500 mg | INTRAMUSCULAR | Status: DC | PRN
  Administered 2022-06-12 (×3): 0.5 mg via INTRAVENOUS

## 2022-06-12 MED ORDER — HYDROMORPHONE HCL 1 MG/ML IJ SOLN
INTRAMUSCULAR | Status: DC | PRN
  Administered 2022-06-12 (×2): .25 mg via INTRAVENOUS
  Administered 2022-06-12 (×2): .5 mg via INTRAVENOUS

## 2022-06-12 MED ORDER — ASPIRIN 325 MG PO TBEC
325.0000 mg | DELAYED_RELEASE_TABLET | Freq: Every day | ORAL | Status: DC
  Administered 2022-06-13: 325 mg via ORAL
  Filled 2022-06-12: qty 1

## 2022-06-12 MED ORDER — ACETAMINOPHEN 10 MG/ML IV SOLN
INTRAVENOUS | Status: AC
  Filled 2022-06-12: qty 100

## 2022-06-12 MED ORDER — HYDROMORPHONE HCL 1 MG/ML IJ SOLN
INTRAMUSCULAR | Status: AC
  Filled 2022-06-12: qty 0.5

## 2022-06-12 MED ORDER — SODIUM CHLORIDE 0.9 % IV SOLN: INTRAVENOUS | Status: DC

## 2022-06-12 MED ORDER — FENTANYL CITRATE (PF) 250 MCG/5ML IJ SOLN
INTRAMUSCULAR | Status: DC | PRN
  Administered 2022-06-12: 25 ug via INTRAVENOUS
  Administered 2022-06-12: 100 ug via INTRAVENOUS
  Administered 2022-06-12: 50 ug via INTRAVENOUS
  Administered 2022-06-12 (×3): 25 ug via INTRAVENOUS

## 2022-06-12 MED ORDER — SUGAMMADEX SODIUM 200 MG/2ML IV SOLN
INTRAVENOUS | Status: DC | PRN
  Administered 2022-06-12: 490 mg via INTRAVENOUS

## 2022-06-12 MED ORDER — HYDROMORPHONE HCL 1 MG/ML IJ SOLN
INTRAMUSCULAR | Status: AC
  Administered 2022-06-12: 1 mg
  Filled 2022-06-12: qty 1

## 2022-06-12 MED ORDER — TAMSULOSIN HCL 0.4 MG PO CAPS
0.4000 mg | ORAL_CAPSULE | Freq: Every evening | ORAL | Status: DC
  Administered 2022-06-12: 0.4 mg via ORAL
  Filled 2022-06-12: qty 1

## 2022-06-12 MED ORDER — HYDROMORPHONE HCL 1 MG/ML IJ SOLN
1.0000 mg | INTRAMUSCULAR | Status: DC | PRN
  Filled 2022-06-12: qty 1

## 2022-06-12 MED ORDER — TRANEXAMIC ACID-NACL 1000-0.7 MG/100ML-% IV SOLN
1000.0000 mg | INTRAVENOUS | Status: AC
  Administered 2022-06-12: 1000 mg via INTRAVENOUS
  Filled 2022-06-12: qty 100

## 2022-06-12 MED ORDER — DEXAMETHASONE SODIUM PHOSPHATE 10 MG/ML IJ SOLN
INTRAMUSCULAR | Status: DC | PRN
  Administered 2022-06-12: 10 mg via INTRAVENOUS

## 2022-06-12 MED ORDER — ONDANSETRON HCL 4 MG/2ML IJ SOLN
INTRAMUSCULAR | Status: AC
  Filled 2022-06-12: qty 2

## 2022-06-12 MED ORDER — METHOCARBAMOL 1000 MG/10ML IJ SOLN: 500.0000 mg | Freq: Four times a day (QID) | INTRAVENOUS | Status: DC | PRN

## 2022-06-12 MED ORDER — ONDANSETRON HCL 4 MG/2ML IJ SOLN
INTRAMUSCULAR | Status: DC | PRN
  Administered 2022-06-12: 4 mg via INTRAVENOUS

## 2022-06-12 MED ORDER — ROPIVACAINE HCL 7.5 MG/ML IJ SOLN
INTRAMUSCULAR | Status: DC | PRN
  Administered 2022-06-12: 20 mL via PERINEURAL

## 2022-06-12 MED ORDER — LOSARTAN POTASSIUM 50 MG PO TABS
100.0000 mg | ORAL_TABLET | Freq: Every morning | ORAL | Status: DC
  Administered 2022-06-13: 100 mg via ORAL
  Filled 2022-06-12: qty 2

## 2022-06-12 MED ORDER — ONDANSETRON HCL 4 MG/2ML IJ SOLN: 4.0000 mg | Freq: Four times a day (QID) | INTRAMUSCULAR | Status: DC | PRN

## 2022-06-12 MED ORDER — OXYCODONE HCL 5 MG PO TABS
5.0000 mg | ORAL_TABLET | ORAL | Status: DC | PRN
  Administered 2022-06-12 – 2022-06-13 (×4): 5 mg via ORAL
  Filled 2022-06-12: qty 1
  Filled 2022-06-12: qty 2
  Filled 2022-06-12 (×2): qty 1

## 2022-06-12 MED ORDER — FENTANYL CITRATE (PF) 100 MCG/2ML IJ SOLN
INTRAMUSCULAR | Status: AC
  Filled 2022-06-12: qty 2

## 2022-06-12 MED ORDER — PROPOFOL 10 MG/ML IV BOLUS
INTRAVENOUS | Status: DC | PRN
  Administered 2022-06-12: 150 mg via INTRAVENOUS

## 2022-06-12 MED ORDER — SUCCINYLCHOLINE CHLORIDE 200 MG/10ML IV SOSY
PREFILLED_SYRINGE | INTRAVENOUS | Status: DC | PRN
  Administered 2022-06-12: 120 mg via INTRAVENOUS

## 2022-06-12 MED ORDER — 0.9 % SODIUM CHLORIDE (POUR BTL) OPTIME
TOPICAL | Status: DC | PRN
  Administered 2022-06-12: 1000 mL

## 2022-06-12 MED ORDER — FUROSEMIDE 20 MG PO TABS
20.0000 mg | ORAL_TABLET | Freq: Every morning | ORAL | Status: DC
  Administered 2022-06-13: 20 mg via ORAL
  Filled 2022-06-12: qty 1

## 2022-06-12 MED ORDER — KETOROLAC TROMETHAMINE 15 MG/ML IJ SOLN
15.0000 mg | Freq: Four times a day (QID) | INTRAMUSCULAR | Status: DC
  Administered 2022-06-12 – 2022-06-13 (×3): 15 mg via INTRAVENOUS
  Filled 2022-06-12 (×3): qty 1

## 2022-06-12 MED ORDER — OXYCODONE HCL 5 MG PO TABS
5.0000 mg | ORAL_TABLET | Freq: Once | ORAL | Status: AC | PRN
  Administered 2022-06-12: 5 mg via ORAL

## 2022-06-12 MED ORDER — CHLORHEXIDINE GLUCONATE 0.12 % MT SOLN
15.0000 mL | Freq: Once | OROMUCOSAL | Status: AC
  Administered 2022-06-12: 15 mL via OROMUCOSAL
  Filled 2022-06-12: qty 15

## 2022-06-12 MED ORDER — METOCLOPRAMIDE HCL 5 MG/ML IJ SOLN: 5.0000 mg | Freq: Three times a day (TID) | INTRAMUSCULAR | Status: DC | PRN

## 2022-06-12 MED ORDER — BUPIVACAINE-EPINEPHRINE (PF) 0.25% -1:200000 IJ SOLN
INTRAMUSCULAR | Status: AC
  Filled 2022-06-12: qty 30

## 2022-06-12 MED ORDER — ONDANSETRON HCL 4 MG PO TABS: 4.0000 mg | ORAL_TABLET | Freq: Four times a day (QID) | ORAL | Status: DC | PRN

## 2022-06-12 MED ORDER — CEFAZOLIN IN SODIUM CHLORIDE 3-0.9 GM/100ML-% IV SOLN
3.0000 g | INTRAVENOUS | Status: AC
  Administered 2022-06-12: 3 g via INTRAVENOUS
  Filled 2022-06-12: qty 100

## 2022-06-12 MED ORDER — METOCLOPRAMIDE HCL 5 MG PO TABS: 5.0000 mg | ORAL_TABLET | Freq: Three times a day (TID) | ORAL | Status: DC | PRN

## 2022-06-12 MED ORDER — ALUM & MAG HYDROXIDE-SIMETH 200-200-20 MG/5ML PO SUSP: 30.0000 mL | ORAL | Status: DC | PRN

## 2022-06-12 MED ORDER — BUPIVACAINE-EPINEPHRINE (PF) 0.25% -1:200000 IJ SOLN
INTRAMUSCULAR | Status: DC | PRN
  Administered 2022-06-12: 30 mL via PERINEURAL

## 2022-06-12 MED ORDER — CEFAZOLIN SODIUM-DEXTROSE 1-4 GM/50ML-% IV SOLN
1.0000 g | Freq: Four times a day (QID) | INTRAVENOUS | Status: AC
  Administered 2022-06-12 (×2): 1 g via INTRAVENOUS
  Filled 2022-06-12 (×2): qty 50

## 2022-06-12 MED ORDER — MIDAZOLAM HCL 2 MG/2ML IJ SOLN: 1.0000 mg | Freq: Once | INTRAMUSCULAR | Status: AC

## 2022-06-12 MED ORDER — OXYCODONE HCL 5 MG PO TABS
ORAL_TABLET | ORAL | Status: AC
  Filled 2022-06-12: qty 1

## 2022-06-12 MED ORDER — FENTANYL CITRATE (PF) 100 MCG/2ML IJ SOLN: 50.0000 ug | Freq: Once | INTRAMUSCULAR | Status: AC

## 2022-06-12 MED ORDER — SUCCINYLCHOLINE CHLORIDE 200 MG/10ML IV SOSY
PREFILLED_SYRINGE | INTRAVENOUS | Status: AC
  Filled 2022-06-12: qty 10

## 2022-06-12 MED ORDER — LACTATED RINGERS IV SOLN: INTRAVENOUS | Status: DC

## 2022-06-12 MED ORDER — PROPOFOL 10 MG/ML IV BOLUS
INTRAVENOUS | Status: AC
  Filled 2022-06-12: qty 20

## 2022-06-12 MED ORDER — HYDRALAZINE HCL 50 MG PO TABS
50.0000 mg | ORAL_TABLET | Freq: Two times a day (BID) | ORAL | Status: DC
  Administered 2022-06-12 – 2022-06-13 (×2): 50 mg via ORAL
  Filled 2022-06-12 (×2): qty 1

## 2022-06-12 MED ORDER — FENTANYL CITRATE (PF) 100 MCG/2ML IJ SOLN
25.0000 ug | INTRAMUSCULAR | Status: DC | PRN
  Administered 2022-06-12 (×3): 50 ug via INTRAVENOUS

## 2022-06-12 MED ORDER — PRAVASTATIN SODIUM 40 MG PO TABS
20.0000 mg | ORAL_TABLET | Freq: Every evening | ORAL | Status: DC
  Administered 2022-06-12: 20 mg via ORAL
  Filled 2022-06-12: qty 1

## 2022-06-12 MED ORDER — MENTHOL 3 MG MT LOZG: 1.0000 | LOZENGE | OROMUCOSAL | Status: DC | PRN

## 2022-06-12 MED ORDER — ROCURONIUM BROMIDE 10 MG/ML (PF) SYRINGE
PREFILLED_SYRINGE | INTRAVENOUS | Status: DC | PRN
  Administered 2022-06-12: 50 mg via INTRAVENOUS

## 2022-06-12 MED ORDER — CLOPIDOGREL BISULFATE 75 MG PO TABS: 75.0000 mg | ORAL_TABLET | Freq: Every evening | ORAL | Status: DC

## 2022-06-12 MED ORDER — ACETAMINOPHEN 10 MG/ML IV SOLN
INTRAVENOUS | Status: DC | PRN
  Administered 2022-06-12: 1000 mg via INTRAVENOUS

## 2022-06-12 MED ORDER — POVIDONE-IODINE 10 % EX SWAB: 2.0000 | Freq: Once | CUTANEOUS | Status: DC

## 2022-06-12 SURGICAL SUPPLY — 67 items
BAG COUNTER SPONGE SURGICOUNT (BAG) ×1 IMPLANT
BANDAGE ESMARK 6X9 LF (GAUZE/BANDAGES/DRESSINGS) ×1 IMPLANT
BLADE SAG 18X100X1.27 (BLADE) ×1 IMPLANT
BNDG ELASTIC 6X5.8 VLCR STR LF (GAUZE/BANDAGES/DRESSINGS) ×2 IMPLANT
BNDG ESMARK 6X9 LF (GAUZE/BANDAGES/DRESSINGS)
BOWL SMART MIX CTS (DISPOSABLE) ×1 IMPLANT
COOLER ICEMAN CLASSIC (MISCELLANEOUS) IMPLANT
COVER SURGICAL LIGHT HANDLE (MISCELLANEOUS) ×1 IMPLANT
CUFF TOURN SGL QUICK 34 (TOURNIQUET CUFF) ×1
CUFF TOURN SGL QUICK 42 (TOURNIQUET CUFF) IMPLANT
CUFF TRNQT CYL 34X4.125X (TOURNIQUET CUFF) ×1 IMPLANT
DRAPE EXTREMITY T 121X128X90 (DISPOSABLE) ×1 IMPLANT
DRAPE HALF SHEET 40X57 (DRAPES) ×1 IMPLANT
DRAPE U-SHAPE 47X51 STRL (DRAPES) ×1 IMPLANT
DURAPREP 26ML APPLICATOR (WOUND CARE) ×1 IMPLANT
ELECT CAUTERY BLADE 6.4 (BLADE) ×1 IMPLANT
ELECT REM PT RETURN 9FT ADLT (ELECTROSURGICAL) ×1
ELECTRODE REM PT RTRN 9FT ADLT (ELECTROSURGICAL) ×1 IMPLANT
FACESHIELD WRAPAROUND (MASK) ×3 IMPLANT
FACESHIELD WRAPAROUND OR TEAM (MASK) ×2 IMPLANT
FEMORAL POSTERIOR SZ5 LFT (Femur) IMPLANT
GAUZE PAD ABD 8X10 STRL (GAUZE/BANDAGES/DRESSINGS) ×1 IMPLANT
GAUZE SPONGE 4X4 12PLY STRL (GAUZE/BANDAGES/DRESSINGS) ×1 IMPLANT
GAUZE XEROFORM 1X8 LF (GAUZE/BANDAGES/DRESSINGS) ×1 IMPLANT
GAUZE XEROFORM 5X9 LF (GAUZE/BANDAGES/DRESSINGS) IMPLANT
GLOVE BIOGEL PI IND STRL 8 (GLOVE) ×2 IMPLANT
GLOVE ORTHO TXT STRL SZ7.5 (GLOVE) ×1 IMPLANT
GLOVE SURG ORTHO 8.0 STRL STRW (GLOVE) ×1 IMPLANT
GOWN STRL REUS W/ TWL LRG LVL3 (GOWN DISPOSABLE) IMPLANT
GOWN STRL REUS W/ TWL XL LVL3 (GOWN DISPOSABLE) ×2 IMPLANT
GOWN STRL REUS W/TWL LRG LVL3 (GOWN DISPOSABLE)
GOWN STRL REUS W/TWL XL LVL3 (GOWN DISPOSABLE) ×2
HANDPIECE INTERPULSE COAX TIP (DISPOSABLE) ×1
IMMOBILIZER KNEE 22 UNIV (SOFTGOODS) ×1 IMPLANT
INSERT TIB PS SZ5 11 KNEE (Miscellaneous) IMPLANT
IV NS 1000ML (IV SOLUTION) ×1
IV NS 1000ML BAXH (IV SOLUTION) ×1 IMPLANT
KIT BASIN OR (CUSTOM PROCEDURE TRAY) ×1 IMPLANT
KIT TURNOVER KIT B (KITS) ×1 IMPLANT
KNEE PATELLA ASYMMETRIC 10X35 (Knees) IMPLANT
KNEE TIBIAL COMPONENT SZ5 (Knees) IMPLANT
MANIFOLD NEPTUNE II (INSTRUMENTS) ×1 IMPLANT
NDL 18GX1X1/2 (RX/OR ONLY) (NEEDLE) IMPLANT
NEEDLE 18GX1X1/2 (RX/OR ONLY) (NEEDLE) IMPLANT
NS IRRIG 1000ML POUR BTL (IV SOLUTION) ×1 IMPLANT
PACK TOTAL JOINT (CUSTOM PROCEDURE TRAY) ×1 IMPLANT
PAD ARMBOARD 7.5X6 YLW CONV (MISCELLANEOUS) ×1 IMPLANT
PAD COLD SHLDR WRAP-ON (PAD) IMPLANT
PADDING CAST COTTON 6X4 STRL (CAST SUPPLIES) ×1 IMPLANT
PIN FLUTED HEDLESS FIX 3.5X1/8 (PIN) IMPLANT
POSTERIOR FEMORAL SZ5 LFT (Femur) ×1 IMPLANT
SET HNDPC FAN SPRY TIP SCT (DISPOSABLE) ×1 IMPLANT
SET PAD KNEE POSITIONER (MISCELLANEOUS) ×1 IMPLANT
STAPLER VISISTAT 35W (STAPLE) ×1 IMPLANT
SUCTION FRAZIER HANDLE 10FR (MISCELLANEOUS) ×1
SUCTION TUBE FRAZIER 10FR DISP (MISCELLANEOUS) ×1 IMPLANT
SUT VIC AB 0 CT1 27 (SUTURE) ×2
SUT VIC AB 0 CT1 27XBRD ANBCTR (SUTURE) ×1 IMPLANT
SUT VIC AB 1 CT1 27 (SUTURE) ×2
SUT VIC AB 1 CT1 27XBRD ANBCTR (SUTURE) ×2 IMPLANT
SUT VIC AB 2-0 CT1 27 (SUTURE) ×2
SUT VIC AB 2-0 CT1 TAPERPNT 27 (SUTURE) ×2 IMPLANT
SYR 50ML LL SCALE MARK (SYRINGE) IMPLANT
TOWEL GREEN STERILE (TOWEL DISPOSABLE) ×1 IMPLANT
TOWEL GREEN STERILE FF (TOWEL DISPOSABLE) ×1 IMPLANT
TRAY CATH 16FR W/PLASTIC CATH (SET/KITS/TRAYS/PACK) IMPLANT
WRAP KNEE MAXI GEL POST OP (GAUZE/BANDAGES/DRESSINGS) ×1 IMPLANT

## 2022-06-12 NOTE — H&P (Signed)
TOTAL KNEE ADMISSION H&P  Patient is being admitted for left total knee arthroplasty.  Subjective:  Chief Complaint:left knee pain.  HPI: Jordan [name], 71 y.o. male, has a history of pain and functional disability in the left knee due to arthritis and has failed non-surgical conservative treatments for greater than 12 weeks to includeNSAID's and/or analgesics, corticosteriod injections, viscosupplementation injections, flexibility and strengthening excercises, use of assistive devices, weight reduction as appropriate, and activity modification.  Onset of symptoms was gradual, starting 3 years ago with gradually worsening course since that time. The patient noted no past surgery on the left knee(s).  Patient currently rates pain in the left knee(s) at 10 out of 10 with activity. Patient has night pain, worsening of pain with activity and weight bearing, pain that interferes with activities of daily living, pain with passive range of motion, crepitus, and joint swelling.  Patient has evidence of subchondral sclerosis, periarticular osteophytes, and joint space narrowing by imaging studies. There is no active infection.  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   Past Medical History:  Diagnosis Date   Arthritis    Back, hand, elbows, knees, hips   Arthritis of left knee 06/12/2022   GERD (gastroesophageal reflux disease)    History of hiatal hernia    Hypertension    Myocardial infarction [facility])    Sleep apnea     Past Surgical History:  Procedure Laterality Date   BACK SURGERY     Lower back fusion   CORONARY STENT PLACEMENT  2014   EYE SURGERY     Bilateral cataract removal   SPINAL CORD STIMULATOR IMPLANT     TOTAL HIP ARTHROPLASTY Right 01/17/2021   Procedure: RIGHT TOTAL HIP ARTHROPLASTY ANTERIOR APPROACH;  Surgeon: Kathryne Hitch, MD;  Location: [facility] OR;  Service: Orthopedics;  Laterality: Right;  Spinal was attempted with patients consent.  Patient has spinal stimulator and unable to get access.   TOTAL KNEE ARTHROPLASTY Right 06/15/2021   Procedure: RIGHT TOTAL KNEE ARTHROPLASTY;  Surgeon: Kathryne Hitch, MD;  Location: [facility] OR;  Service: Orthopedics;  Laterality: Right;    Current Facility-Administered Medications  Medication Dose Route Frequency Provider Last Rate Last Admin   ceFAZolin (ANCEF) IVPB 3g/100 mL premix  3 g Intravenous On Call to OR Kirtland Bouchard, PA-C       fentaNYL (SUBLIMAZE) 100 MCG/2ML injection            lactated ringers infusion   Intravenous Continuous Kathryne Hitch, MD 10 mL/hr at 06/12/22 0840 New Bag at 06/12/22 0840   midazolam (VERSED) 2 MG/2ML injection            povidone-iodine 10 % swab 2 Application  2 Application Topical Once Kirtland Bouchard, PA-C       tranexamic acid (CYKLOKAPRON) IVPB 1,000 mg  1,000 mg Intravenous To OR Kirtland Bouchard, PA-C       Allergies  Allergen Reactions   Iodinated Contrast Media Shortness Of Breath, Itching and Other (See Comments)    Burning sensation   Neurontin [Gabapentin] Hives, Rash and Other (See Comments)    Burning sensation   Norvasc [Amlodipine] Swelling    Social History   Tobacco Use   Smoking status: Never   Smokeless tobacco: Never  Substance Use Topics   Alcohol use: Yes  Comment: Drinks no more than 2 cases of beer per year    No family history on file.   Review of Systems  Musculoskeletal:  Positive for gait problem and joint swelling.  All other systems reviewed and are negative.   Objective:  Physical Exam Vitals reviewed.  Constitutional:      Appearance: Normal appearance. He is obese.  HENT:     Head: Normocephalic and atraumatic.  Eyes:     Pupils: Pupils are equal, round, and reactive to light.  Cardiovascular:     Rate and Rhythm: Normal rate.  Pulmonary:     Effort:  Pulmonary effort is normal.     Breath sounds: Normal breath sounds.  Abdominal:     Palpations: Abdomen is soft.  Musculoskeletal:     Cervical back: Normal range of motion and neck supple.     Left knee: Effusion, bony tenderness and crepitus present. Decreased range of motion. Tenderness present over the medial joint line, lateral joint line and patellar tendon. Abnormal alignment.  Neurological:     Mental Status: He is alert and oriented to person, place, and time.  Psychiatric:        Behavior: Behavior normal.     Vital signs in last 24 hours: Temp:  [97.8 F (36.6 C)] 97.8 F (36.6 C) (10/31 0814) Pulse Rate:  [61] 61 (10/31 0814) Resp:  [18] 18 (10/31 0814) BP: (149)/(83) 149/83 (10/31 0814) SpO2:  [96 %] 96 % (10/31 0814) Weight:  [122.5 kg] 122.5 kg (10/31 0814)  Labs:   Estimated body mass index is 36.62 kg/m as calculated from the following:   Height as of this encounter: 6' (1.829 m).   Weight as of this encounter: 122.5 kg.   Imaging Review Plain radiographs demonstrate severe degenerative joint disease of the left knee(s). The overall alignment ismild varus. The bone quality appears to be excellent for age and reported activity level.      Assessment/Plan:  End stage arthritis, left knee   The patient history, physical examination, clinical judgment of the provider and imaging studies are consistent with end stage degenerative joint disease of the left knee(s) and total knee arthroplasty is deemed medically necessary. The treatment options including medical management, injection therapy arthroscopy and arthroplasty were discussed at length. The risks and benefits of total knee arthroplasty were presented and reviewed. The risks due to aseptic loosening, infection, stiffness, patella tracking problems, thromboembolic complications and other imponderables were discussed. The patient acknowledged the explanation, agreed to proceed with the plan and consent was  signed. Patient is being admitted for inpatient treatment for surgery, pain control, PT, OT, prophylactic antibiotics, VTE prophylaxis, progressive ambulation and ADL's and discharge planning. The patient is planning to be discharged home with home health services

## 2022-06-12 NOTE — Transfer of Care (Signed)
Immediate Anesthesia Transfer of Care Note  Patient: Jordan [name]  Procedure(s) Performed: LEFT TOTAL KNEE ARTHROPLASTY (Left: Knee)  Patient Location: PACU  Anesthesia Type:GA combined with regional for post-op pain  Level of Consciousness: awake, alert , and oriented  Airway & Oxygen Therapy: Patient Spontanous Breathing and Patient connected to nasal cannula oxygen  Post-op Assessment: Report given to RN, Post -op Vital signs reviewed and stable, Patient moving all extremities X 4, and Patient able to stick tongue midline  Post vital signs: Reviewed  Last Vitals:  Vitals Value Taken Time  BP 149/81 06/12/22 1145  Temp 36.8 C 06/12/22 1140  Pulse 74 06/12/22 1145  Resp 18 06/12/22 1145  SpO2 96 % 06/12/22 1145  Vitals shown include unvalidated device data.  Last Pain:  Vitals:   06/12/22 1140  TempSrc:   PainSc: 10-Worst pain ever         Complications: No notable events documented.

## 2022-06-12 NOTE — Op Note (Signed)
Operative Note  Date of operation: 06/12/2022 Preoperative diagnosis: Left knee osteoarthritis and degenerative joint disease Postop diagnosis: Same  Procedure: Left press-fit total knee arthroplasty  Implants: Stryker triathlon press-fit knee system with size 5 femur, size 5 tibial tray, 11 mm fixed-bearing polythene insert, 35 mm patella button  Surgeon: Lind Guest. Ninfa Linden, MD Assistant: Benita Stabile, PA-C  Anesthesia: #1 left lower extremity regional block, #2 General, #3 local Tourniquet time: Under 1 hour Antibiotics: 3 g IV Ancef EBL: Less than 100 cc Locations: None  Indications: The patient is a 71 year old active gentleman well-known to me.  We have actually placed a hip in his right knee before.  He has well-documented debilitating arthritis involving his left knee.  He has tried and failed all forms conservative treatment and does wish to proceed with a left total knee arthroplasty given the severity of his left knee pain combined with his arthritis and given the good outcome he had with a right knee replacement.  Having had this before he is fully aware of the risk and benefits of the surgery.  Procedure description: After informed consent was obtained and the appropriate left knee was marked, anesthesia obtained the left lower extremity block in the holding room and he was brought to the operating room and placed upon the operating table.  General anesthesia was obtained.  A nonsterile tourniquet is placed around his upper left thigh and his left thigh, leg, knee, ankle were prepped and draped with DuraPrep and sterile drapes including a sterile stockinette.  A timeout was called and he identified the correct patient correct left knee.  We then used Esmarch wrap out the leg and tourniquet was plated to 300 mm of pressure.  I then made a direct midline incision over the patella and carried this proximally distally.  I dissected down the joint carried out a medial parapatellar  arthrotomy finding a moderate joint effusion.  We did find severe cartilage wear in all 3 compartments.  With the knee in a flexed position we removed remnants of the ACL, PCL and medial lateral meniscus.  We removed osteophytes in all 3 compartments.  We then made our proximal tibia cut using extramedullary cutting guide correction varus and valgus and neutral slope.  We made this cut to take 9 mm off the high side.  We made this cut without difficulty.  We then used a intramedullary dual for our distal femoral cut setting this for a left knee at 5 degrees external rotated and a 10 mm distal femoral cut.  We made the cut without difficulty and brought the knee back down full extension and it was tight but we had full extension with a 10 mm block.  We then went back to the femur and put a femoral sizing guide based off the epicondylar axis and Whitesides line.  Based off of this we chose a size 5 femur.  We put a 4-in-1 cutting block for size 5 femur and made her anterior and posterior cuts followed by her chamfer cuts.  We then back to the tibia and chose a size 5 tibial tray for coverage over the tibial plateau setting the rotation of the tibial tubercle and the femur.  Given his good quality bone we did our keel punch for a press-fit knee system.  We then trialed our size 5 tibia with our size 5 left femur we placed a 9 mm and then 11 mm fixed-bearing polythene insert we are pleased with stability with the 11 mm  insert.  We then made a patella cut and drilled 3 holes for a size 35 press-fit patella button.  We then removed all instrumentation from the knee and irrigate the knee with normal saline solution.  We then dried the neuro well and placed our Marcaine with epinephrine around the arthrotomy.  With the knee in a flexed position we then placed our Stryker triathlon press-fit tibia component size 5 followed by our size 5 left press-fit femur.  We placed our real 11 mm thickness fixed-bearing polythene insert  and press-fit our size 35 patella button.  We then put the knee through several cycles of motion we are pleased with range of motion and stability.  We then the tourniquet down and hemostasis was obtained electrocautery.  We then closed the arthrotomy with interrupted #1 Vicryl suture followed by 0 Vicryl to close the deep tissue and 2-0 Vicryl to close the subcutaneous tissue.  The skin was closed with staples.  Well-padded sterile dressing was applied.  The patient was awakened, extubated and taken recovery in stable addition with all final counts being correct and no complications noted.  Of note go-cart PA-C did assist the entire case from beginning to end and his assistance was crucial for soft tissue management and retraction, helping guide implant placement and a layered closure of the wound.

## 2022-06-12 NOTE — Anesthesia Procedure Notes (Signed)
Procedure Name: Intubation Date/Time: 06/12/2022 9:50 AM  Performed by: Maude Leriche, CRNAPre-anesthesia Checklist: Patient identified, Emergency Drugs available, Suction available and Patient being monitored Patient Re-evaluated:Patient Re-evaluated prior to induction Oxygen Delivery Method: Circle system utilized Preoxygenation: Pre-oxygenation with 100% oxygen Induction Type: IV induction Ventilation: Mask ventilation without difficulty Laryngoscope Size: Miller and 3 Grade View: Grade I Tube type: Oral Tube size: 8.0 mm Number of attempts: 1 Airway Equipment and Method: Stylet Placement Confirmation: ETT inserted through vocal cords under direct vision, positive ETCO2 and breath sounds checked- equal and bilateral Secured at: 23 cm Tube secured with: Tape Dental Injury: Teeth and Oropharynx as per pre-operative assessment

## 2022-06-12 NOTE — Anesthesia Procedure Notes (Signed)
Anesthesia Regional Block: Adductor canal block   Pre-Anesthetic Checklist: , timeout performed,  Correct Patient, Correct Site, Correct Laterality,  Correct Procedure, Correct Position, site marked,  Risks and benefits discussed,  Surgical consent,  Pre-op evaluation,  At surgeon's request and post-op pain management  Laterality: Left  Prep: chloraprep       Needles:  Injection technique: Single-shot  Needle Type: Echogenic Needle     Needle Length: 9cm  Needle Gauge: 21     Additional Needles:   Narrative:  Start time: 06/12/2022 9:40 AM End time: 06/12/2022 9:47 AM Injection made incrementally with aspirations every 5 mL.  Performed by: Personally  Anesthesiologist: Albertha Ghee, MD  Additional Notes: Pt tolerated the procedure well.

## 2022-06-12 NOTE — Evaluation (Signed)
Physical Therapy Evaluation Patient Details Name: Jordan [name] MRN: [id number] DOB: March 08, 1951 Today's Date: 06/12/2022  History of Present Illness  Pt is 71 yo male s/p L TKA on 06/12/22.  Pt with hx of R THA, R TKA, lumbar fusion, arthritis, GERD, HTN, MI, and sleep apnea  Clinical Impression  Pt is s/p TKA resulting in the deficits listed below (see PT Problem List). At baseline, pt independent.  He has DME at home and family support.  Today, pt progressing well for DOS.  He had good quad activation, pain control, and L Knee ROM of 5 to 80 degrees.  He was able to ambulate 53' with RW and min guard - tolerated well.  Expected to progress very well with therapy. Pt will benefit from skilled PT to increase their independence and safety with mobility to allow discharge to the venue listed below.         Recommendations for follow up therapy are one component of a multi-disciplinary discharge planning process, led by the attending physician.  Recommendations may be updated based on patient status, additional functional criteria and insurance authorization.  Follow Up Recommendations Follow physician's recommendations for discharge plan and follow up therapies      Assistance Recommended at Discharge Intermittent Supervision/Assistance  Patient can return home with the following  A little help with walking and/or transfers;A little help with bathing/dressing/bathroom;Help with stairs or ramp for entrance;Assistance with cooking/housework    Equipment Recommendations None recommended by PT  Recommendations for Other Services       Functional Status Assessment Patient has had a recent decline in their functional status and demonstrates the ability to make significant improvements in function in a reasonable and predictable amount of time.     Precautions / Restrictions Precautions Precautions: Fall Required Braces or Orthoses: Knee Immobilizer - Left Knee Immobilizer - Left: Discontinue  once straight leg raise with < 10 degree lag Restrictions Weight Bearing Restrictions: Yes LLE Weight Bearing: Weight bearing as tolerated      Mobility  Bed Mobility Overal bed mobility: Needs Assistance Bed Mobility: Supine to Sit, Sit to Supine     Supine to sit: Supervision          Transfers Overall transfer level: Needs assistance Equipment used: Rolling walker (2 wheels) Transfers: Sit to/from Stand Sit to Stand: Min guard           General transfer comment: cues for hand placement and L LE management    Ambulation/Gait Ambulation/Gait assistance: Min guard Gait Distance (Feet): 70 Feet Assistive device: Rolling walker (2 wheels) Gait Pattern/deviations: Step-to pattern, Decreased stride length, Decreased weight shift to left Gait velocity: decreased     General Gait Details: cues for RW proximity  Stairs            Wheelchair Mobility    Modified Rankin (Stroke Patients Only)       Balance Overall balance assessment: Needs assistance Sitting-balance support: No upper extremity supported Sitting balance-Leahy Scale: Good     Standing balance support: Bilateral upper extremity supported, No upper extremity supported Standing balance-Leahy Scale: Fair Standing balance comment: RW to ambulate but could static stand without UE support                             Pertinent Vitals/Pain Pain Assessment Pain Assessment: 0-10 Pain Score: 4  Pain Location: L knee Pain Descriptors / Indicators: Discomfort, Sore Pain Intervention(s): Limited activity within patient's tolerance, Monitored  during session, Repositioned, Ice applied    Home Living Family/patient expects to be discharged to:: Private residence Living Arrangements: Spouse/significant other Available Help at Discharge: Family;Available 24 hours/day Type of Home: Mobile home Home Access: Stairs to enter Entrance Stairs-Rails: None Entrance Stairs-Number of Steps: 4    Home Layout: One level Home Equipment: Conservation officer, nature (2 wheels);Rollator (4 wheels);Cane - single point;Grab bars - tub/shower;Shower seat      Prior Function Prior Level of Function : Independent/Modified Independent             Mobility Comments: Used a cane at times; could ambulate in community ADLs Comments: could do everything except socks     Hand Dominance        Extremity/Trunk Assessment   Upper Extremity Assessment Upper Extremity Assessment: Overall WFL for tasks assessed    Lower Extremity Assessment Lower Extremity Assessment: LLE deficits/detail;RLE deficits/detail RLE Deficits / Details: ROM WFL; MMT 5/5 LLE Deficits / Details: Expected post op changes; ROM: ankle and hip WFL, knee 5 to 80 degrees; MMT: ankle 5/5, knee and hip 3/5, 5 degrees extensor lag    Cervical / Trunk Assessment Cervical / Trunk Assessment: Normal  Communication   Communication: No difficulties  Cognition Arousal/Alertness: Awake/alert Behavior During Therapy: WFL for tasks assessed/performed Overall Cognitive Status: Within Functional Limits for tasks assessed                                          General Comments General comments (skin integrity, edema, etc.): VSS; on RA sats 96% -left on RA notified RN    Exercises     Assessment/Plan    PT Assessment Patient needs continued PT services  PT Problem List Decreased strength;Decreased mobility;Decreased range of motion;Decreased activity tolerance;Decreased balance;Decreased knowledge of use of DME;Pain       PT Treatment Interventions Therapeutic activities;DME instruction;Gait training;Therapeutic exercise;Patient/family education;Modalities;Stair training;Balance training;Functional mobility training;Neuromuscular re-education    PT Goals (Current goals can be found in the Care Plan section)  Acute Rehab PT Goals Patient Stated Goal: return home tomorrow if able PT Goal Formulation: With  patient/family Time For Goal Achievement: 06/26/22 Potential to Achieve Goals: Good    Frequency 7X/week     Co-evaluation               AM-PAC PT "6 Clicks" Mobility  Outcome Measure Help needed turning from your back to your side while in a flat bed without using bedrails?: None Help needed moving from lying on your back to sitting on the side of a flat bed without using bedrails?: A Little Help needed moving to and from a bed to a chair (including a wheelchair)?: A Little Help needed standing up from a chair using your arms (e.g., wheelchair or bedside chair)?: A Little Help needed to walk in hospital room?: A Little Help needed climbing 3-5 steps with a railing? : A Little 6 Click Score: 19    End of Session Equipment Utilized During Treatment: Gait belt Activity Tolerance: Patient tolerated treatment well Patient left: in chair;with call bell/phone within reach;with family/visitor present Nurse Communication: Mobility status PT Visit Diagnosis: Other abnormalities of gait and mobility (R26.89);Muscle weakness (generalized) (M62.81)    Time: [id number] PT Time Calculation (min) (ACUTE ONLY): 26 min   Charges:   PT Evaluation $PT Eval Low Complexity: 1 Low PT Treatments $Gait Training: 8-22 mins  Abran Richard, PT Acute Rehab [facility] [phone]   Karlton Lemon 06/12/2022, 4:38 PM

## 2022-06-13 DIAGNOSIS — M1712 Unilateral primary osteoarthritis, left knee: Secondary | ICD-10-CM | POA: Diagnosis not present

## 2022-06-13 LAB — BASIC METABOLIC PANEL
Anion gap: 8 (ref 5–15)
BUN: 21 mg/dL (ref 8–23)
CO2: 23 mmol/L (ref 22–32)
Calcium: 8.2 mg/dL — ABNORMAL LOW (ref 8.9–10.3)
Chloride: 106 mmol/L (ref 98–111)
Creatinine, Ser: 1.07 mg/dL (ref 0.61–1.24)
GFR, Estimated: >60 mL/min (ref >60)
Glucose, Bld: 118 mg/dL — ABNORMAL HIGH (ref 70–99)
Potassium: 4.1 mmol/L (ref 3.5–5.1)
Sodium: 137 mmol/L (ref 135–145)

## 2022-06-13 LAB — CBC
HCT: 35.2 % — ABNORMAL LOW (ref 39.0–52.0)
Hemoglobin: 11.7 g/dL — ABNORMAL LOW (ref 13.0–17.0)
MCH: 30.4 pg (ref 26.0–34.0)
MCHC: 33.2 g/dL (ref 30.0–36.0)
MCV: 91.4 fL (ref 80.0–100.0)
Platelets: 225 10*3/uL (ref 150–400)
RBC: 3.85 MIL/uL — ABNORMAL LOW (ref 4.22–5.81)
RDW: 13.2 % (ref 11.5–15.5)
WBC: 12.3 10*3/uL — ABNORMAL HIGH (ref 4.0–10.5)
nRBC: 0 % (ref 0.0–0.2)

## 2022-06-13 MED ORDER — OXYCODONE HCL 5 MG PO TABS: 5.0000 mg | ORAL_TABLET | ORAL | 0 refills | Status: DC | PRN

## 2022-06-13 MED ORDER — ASPIRIN 325 MG PO TBEC: 325.0000 mg | DELAYED_RELEASE_TABLET | Freq: Every day | ORAL | 0 refills | Status: AC

## 2022-06-13 MED ORDER — METHOCARBAMOL 500 MG PO TABS: 500.0000 mg | ORAL_TABLET | Freq: Four times a day (QID) | ORAL | 1 refills | Status: DC | PRN

## 2022-06-13 NOTE — Plan of Care (Signed)
  Problem: Education: Goal: Knowledge of General Education information will improve Description: Including pain rating scale, medication(s)/side effects and non-pharmacologic comfort measures Outcome: Progressing   Problem: Clinical Measurements: Goal: Ability to maintain clinical measurements within normal limits will improve Outcome: Progressing   Problem: Activity: Goal: Risk for activity intolerance will decrease Outcome: Progressing   Problem: Skin Integrity: Goal: Risk for impaired skin integrity will decrease Outcome: Progressing   Problem: Pain Management: Goal: Pain level will decrease with appropriate interventions Outcome: Progressing   Problem: Skin Integrity: Goal: Will show signs of wound healing Outcome: Progressing

## 2022-06-13 NOTE — Progress Notes (Signed)
Subjective: 1 Day Post-Op Procedure(s) (LRB): LEFT TOTAL KNEE ARTHROPLASTY (Left) Patient reports pain as moderate.    Objective: Vital signs in last 24 hours: Temp:  [97.8 F (36.6 C)-98.7 F (37.1 C)] 97.9 F (36.6 C) (10/31 2231) Pulse Rate:  [58-85] 75 (11/01 0602) Resp:  [12-29] 18 (10/31 2231) BP: (133-177)/(67-94) 140/67 (11/01 0602) SpO2:  [94 %-99 %] 97 % (10/31 2231) Weight:  [122.5 kg] 122.5 kg (10/31 0814)  Intake/Output from previous day: 10/31 0701 - 11/01 0700 In: 2013.8 [I.V.:1613.8; IV Piggyback:400] Out: 535 [Urine:525; Blood:10] Intake/Output this shift: No intake/output data recorded.  Recent Labs    06/13/22 0437  HGB 11.7*   Recent Labs    06/13/22 0437  WBC 12.3*  RBC 3.85*  HCT 35.2*  PLT 225   Recent Labs    06/13/22 0437  NA 137  K 4.1  CL 106  CO2 23  BUN 21  CREATININE 1.07  GLUCOSE 118*  CALCIUM 8.2*   No results for input(s): "LABPT", "INR" in the last 72 hours.  Sensation intact distally Intact pulses distally Dorsiflexion/Plantar flexion intact Incision: dressing C/D/I No cellulitis present Compartment soft   Assessment/Plan: 1 Day Post-Op Procedure(s) (LRB): LEFT TOTAL KNEE ARTHROPLASTY (Left) Up with therapy Discharge home with home health this afternoon.      Jordan [name] 06/13/2022, 7:47 AM

## 2022-06-13 NOTE — TOC Transition Note (Signed)
Transition of Care (TOC) - CM/SW Discharge Note Marvetta Gibbons RN, BSN Transitions of Care Unit 4E- RN Case Manager See Treatment Team for direct phone # Cross Coverage for 5N  Patient Details  Name: Jordan [name] MRN: [id number] Date of Birth: [birth year]  Transition of Care [facility]) CM/SW Contact:  Dawayne Patricia, RN Phone Number: 06/13/2022, 12:20 PM   Clinical Narrative:    Pt stable for transition home today s/p total knee.  Noted orders placed for [facility] and DME needs.  With reference to arrangements made with [facility] for DME and HH w/ [facility].  Contacted Sonia Side w/ [facility] to confirm [facility] arrangements - however per Margarito Courser is unable to accept referral at this time due to staffing in pt's area.  Contacted [facility] as well- and they state they do not have pt in their system for DME needs.   Call made to Ortho office and spoke with Sherri to clarify arrangements. Per conversation with Venida Jarvis pt is not in an ortho bundle and she did not make any pre-arrangements for [facility] or DME. Ortho office did make a referral to [facility] [place] but as stated above [facility] unable to service. Per Venida Jarvis pt will need to be set up by hospital staff for [facility] and DME needs.   DME referral sent to [facility] via [facility] system for RW and BSC- CM notified by bedside RN that pt reporting that he has a BSC at home that he can use and only needs RW for home. [facility] updated - and RW to be delivered to room prior to discharge. Wife present at bedside for transport home.   Calls made for HHPT needs to: [facility]- do not service [place] [facility]- unable to accept due to staffing [facility]- unable to accept due to staffing [facility]- unable to accept at this time  [facility]- referral has been accepted- and they will call for scheduling post discharge.    Final next level of care: [place] Barriers to Discharge: No Barriers Identified   Patient Goals and CMS Choice Patient states their  goals for this hospitalization and ongoing recovery are:: return home CMS Medicare.gov Compare Post Acute Care list provided to::  (spoke with Ortho office CM- Sherri) Choice offered to / list presented to : NA  Discharge Placement                 Home w/ [facility]      Discharge Plan and Services   Discharge Planning Services: CM Consult Post Acute Care Choice: Durable Medical Equipment, Home Health          DME Arranged: 3-N-1, Walker rolling DME Agency: [facility] Date DME Agency Contacted: 06/13/22 Time DME Agency Contacted: 23 Representative spoke with at DME Agency: [place] system HH Arranged: PT [place]: [place] Date [place]: 06/13/22 Time [place]: 1219 Representative spoke with at [place]: [place] Determinants of Health ([place]) Interventions     Readmission Risk Interventions     No data to display

## 2022-06-13 NOTE — Progress Notes (Signed)
Pt discharged home with spouse and daughter in stable  condition.  Discharge instructions given. Scripts sent to pharmacy of choice. Rolling walker delivered to room. No immediate questions or concerns at this time. Pt discharged from unit via wheelchair.

## 2022-06-13 NOTE — Discharge Summary (Signed)
Patient ID: Jordan [name] MRN: [id number] DOB/AGE: 1951/02/11 71 y.o.  Admit date: 06/12/2022 Discharge date: 06/13/2022  Admission Diagnoses:  Principal Problem:   Arthritis of left knee Active Problems:   Status post total left knee replacement   Discharge Diagnoses:  Same  Past Medical History:  Diagnosis Date   Arthritis    Back, hand, elbows, knees, hips   Arthritis of left knee 06/12/2022   GERD (gastroesophageal reflux disease)    History of hiatal hernia    Hypertension    Myocardial infarction [facility])    Sleep apnea     Surgeries: Procedure(s): LEFT TOTAL KNEE ARTHROPLASTY on 06/12/2022   Consultants:   Discharged Condition: Improved  Hospital Course: Owenn Rothermel is an 71 y.o. male who was admitted 06/12/2022 for operative treatment ofArthritis of left knee. Patient has severe unremitting pain that affects sleep, daily activities, and work/hobbies. After pre-op clearance the patient was taken to the operating room on 06/12/2022 and underwent  Procedure(s): LEFT TOTAL KNEE ARTHROPLASTY.    Patient was given perioperative antibiotics:  Anti-infectives (From admission, onward)    Start     Dose/Rate Route Frequency Ordered Stop   06/12/22 1615  ceFAZolin (ANCEF) IVPB 1 g/50 mL premix        1 g 100 mL/hr over 30 Minutes Intravenous Every 6 hours 06/12/22 1519 06/13/22 0206   06/12/22 0815  ceFAZolin (ANCEF) IVPB 3g/100 mL premix        3 g 200 mL/hr over 30 Minutes Intravenous On call to O.R. 06/12/22 6734 06/12/22 1937        Patient was given sequential compression devices, early ambulation, and chemoprophylaxis to prevent DVT.  Patient benefited maximally from hospital stay and there were no complications.    Recent vital signs: Patient Vitals for the past 24 hrs:  BP Temp Temp src Pulse Resp SpO2  06/13/22 0810 -- 97.8 F (36.6 C) Oral 73 -- 99 %  06/13/22 0602 (!) 140/67 -- -- 75 -- --  06/12/22 2231 (!) 146/94 97.9 F (36.6 C) Oral 75 18 97 %   06/12/22 2206 (!) 155/76 -- -- -- -- --  06/12/22 1625 (!) 152/90 98 F (36.7 C) Oral 84 20 96 %  06/12/22 1510 (!) 177/76 98.7 F (37.1 C) Oral 85 20 96 %  06/12/22 1430 (!) 153/84 98.5 F (36.9 C) -- 63 17 96 %  06/12/22 1400 (!) 167/91 -- -- 61 13 98 %  06/12/22 1345 (!) 147/74 -- -- (!) 58 12 95 %  06/12/22 1330 (!) 157/94 -- -- 69 (!) 29 98 %  06/12/22 1315 (!) 159/80 -- -- 63 15 98 %  06/12/22 1300 (!) 146/75 -- -- 66 12 99 %     Recent laboratory studies:  Recent Labs    06/13/22 0437  WBC 12.3*  HGB 11.7*  HCT 35.2*  PLT 225  NA 137  K 4.1  CL 106  CO2 23  BUN 21  CREATININE 1.07  GLUCOSE 118*  CALCIUM 8.2*     Discharge Medications:   Allergies as of 06/13/2022       Reactions   Iodinated Contrast Media Shortness Of Breath, Itching, Other (See Comments)   Burning sensation   Neurontin [gabapentin] Hives, Rash, Other (See Comments)   Burning sensation   Norvasc [amlodipine] Swelling        Medication List     TAKE these medications    aspirin EC 325 MG tablet Take 1 tablet (325 mg total)  by mouth daily with breakfast.   chlorthalidone 25 MG tablet Commonly known as: HYGROTON Take 25 mg by mouth every evening.   clopidogrel 75 MG tablet Commonly known as: PLAVIX Take 75 mg by mouth every evening.   clotrimazole-betamethasone cream Commonly known as: LOTRISONE Apply 1 application  topically daily as needed (skin irritation.).   fluticasone 50 MCG/ACT nasal spray Commonly known as: FLONASE Place 1 spray into both nostrils daily as needed for allergies.   furosemide 20 MG tablet Commonly known as: LASIX Take 20 mg by mouth in the morning.   hydrALAZINE 50 MG tablet Commonly known as: APRESOLINE Take 50 mg by mouth 2 (two) times daily.   losartan 100 MG tablet Commonly known as: COZAAR Take 100 mg by mouth in the morning.   methocarbamol 500 MG tablet Commonly known as: ROBAXIN Take 1 tablet (500 mg total) by mouth every 6 (six)  hours as needed for muscle spasms.   metoprolol succinate 50 MG 24 hr tablet Commonly known as: TOPROL-XL Take 50 mg by mouth in the morning. Take with or immediately following a meal.   omeprazole 20 MG capsule Commonly known as: PRILOSEC Take 40 mg by mouth every evening.   oxyCODONE 5 MG immediate release tablet Commonly known as: Oxy IR/ROXICODONE Take 1-2 tablets (5-10 mg total) by mouth every 4 (four) hours as needed for moderate pain (pain score 4-6).   pravastatin 20 MG tablet Commonly known as: PRAVACHOL Take 20 mg by mouth every evening.   tadalafil 20 MG tablet Commonly known as: CIALIS Take 20 mg by mouth daily as needed for erectile dysfunction.   tamsulosin 0.4 MG Caps capsule Commonly known as: FLOMAX Take 0.4 mg by mouth every evening.               Durable Medical Equipment  (From admission, onward)           Start     Ordered   06/12/22 1520  DME 3 n 1  Once        06/12/22 1519   06/12/22 1520  DME Walker rolling  Once       Question Answer Comment  Walker: With 5 Inch Wheels   Patient needs a walker to treat with the following condition Status post total left knee replacement      06/12/22 1519            Diagnostic Studies: DG Knee Left Port  Result Date: 06/12/2022 CLINICAL DATA:  Status post left knee replacement. EXAM: PORTABLE LEFT KNEE - 1-2 VIEW COMPARISON:  March 28, 2022. FINDINGS: The left femoral, tibial and patellar components are well situated. Expected postoperative changes are seen involving the soft tissues anteriorly. IMPRESSION: Status post left total knee arthroplasty. Electronically Signed   By: Marijo Conception M.D.   On: 06/12/2022 12:21    Disposition: Discharge disposition: 01-Home or [place]     Mcarthur Rossetti, MD Follow up in 2 week(s).   Specialty: Orthopedic Surgery Contact information: [place] [address] [facility] Follow up.   Why: ([facility])  - Rolling walker and Bedside commode- arranged- to be delivered to room prior to discharge Contact information: [place] [address] [phone]         Care, [facility] Follow up.   Specialty: Home Health Services Why:  HHPT arranged- they will contact you to schedule visits Contact information: [address] [place] [address] [phone]                  Signed: Kathryne Hitch 06/13/2022, 12:55 PM

## 2022-06-13 NOTE — Progress Notes (Signed)
Physical Therapy Treatment Patient Details Name: Jordan [name] MRN: [id number] DOB: 12-Jul-1951 Today's Date: 06/13/2022   History of Present Illness Pt is 71 yo male s/p L TKA on 06/12/22.  Pt with hx of R THA, R TKA, lumbar fusion, arthritis, GERD, HTN, MI, and sleep apnea.    PT Comments    Pt received in supine, agreeable to therapy session and with good participation and tolerance for transfer, gait and stair training with RW. Pt spouse present and able to demo guarding positions for assisting pt with stair ascent/descent with teachback by PTA. Pt instructed on L knee HEP and precautions and handout given to reinforce. Iceman refilled as pt reporting moderately severe pain 7-8/10 with weight bearing at end of session, RN notified. Pt continues to benefit from PT services to progress toward functional mobility goals. Anticipate pt safe to DC home with [facility] 2 services per MD discharge plan once medically cleared.   Recommendations for follow up therapy are one component of a multi-disciplinary discharge planning process, led by the attending physician.  Recommendations may be updated based on patient status, additional functional criteria and insurance authorization.  Follow Up Recommendations  Follow physician's recommendations for discharge plan and follow up therapies     Assistance Recommended at Discharge Intermittent Supervision/Assistance  Patient can return home with the following A little help with walking and/or transfers;A little help with bathing/dressing/bathroom;Help with stairs or ramp for entrance;Assistance with cooking/housework   Equipment Recommendations  None recommended by PT    Recommendations for Other Services       Precautions / Restrictions Precautions Precautions: Fall;Knee Precaution Booklet Issued: Yes (comment) Precaution Comments: pt receptive Knee Immobilizer - Left: Discontinue once straight leg raise with < 10 degree lag Restrictions Weight Bearing  Restrictions: Yes LLE Weight Bearing: Weight bearing as tolerated     Mobility  Bed Mobility Overal bed mobility: Needs Assistance Bed Mobility: Supine to Sit     Supine to sit: Supervision     General bed mobility comments: HOB elevated as per pt he will be sleeping in a lift chair initially, no assist needed    Transfers Overall transfer level: Needs assistance Equipment used: Rolling walker (2 wheels) Transfers: Sit to/from Stand Sit to Stand: Min guard           General transfer comment: cues for hand placement and L LE management for pain relief    Ambulation/Gait Ambulation/Gait assistance: Min guard, Supervision Gait Distance (Feet): 200 Feet (x2 with seated rest break in PT gym) Assistive device: Rolling walker (2 wheels) Gait Pattern/deviations: Step-to pattern, Decreased stride length, Decreased weight shift to left Gait velocity: decreased     General Gait Details: cues for RW proximity   Stairs Stairs: Yes Stairs assistance: Min guard Stair Management: One rail Right, Step to pattern, Forwards (HHA on LUE rail on RUE per home set-up) Number of Stairs: 2 General stair comments: good carryover of initial cueing for technique   Wheelchair Mobility    Modified Rankin (Stroke Patients Only)       Balance Overall balance assessment: Needs assistance Sitting-balance support: No upper extremity supported Sitting balance-Leahy Scale: Good     Standing balance support: Bilateral upper extremity supported, No upper extremity supported Standing balance-Leahy Scale: Fair Standing balance comment: RW to ambulate but could static stand without UE support                            Cognition Arousal/Alertness: Awake/alert  Behavior During Therapy: WFL for tasks assessed/performed Overall Cognitive Status: Within Functional Limits for tasks assessed                                          Exercises Other Exercises Other  Exercises: ankle pumps x10 reps ea, quad sets x 5 reps, visual/verbal demo for other HEP from handout, pt reports he performed some HS/knee flexion earlier in day, pt receptive. Other Exercises: L knee ROM grossly 5 deg to 85 deg    General Comments General comments (skin integrity, edema, etc.): SpO2 100% on RA, HR 70's bpm resting post-exertion      Pertinent Vitals/Pain Pain Assessment Pain Assessment: 0-10 Pain Score: 8  Pain Location: L knee w/ambulation Pain Descriptors / Indicators: Discomfort, Sore Pain Intervention(s): Monitored during session, Premedicated before session, Repositioned, Patient requesting pain meds-RN notified, Ice applied (PTA refilled iceman at end of session)     PT Goals (current goals can now be found in the care plan section) Acute Rehab PT Goals Patient Stated Goal: return home today and get back to hunting/fishing PT Goal Formulation: With patient/family Time For Goal Achievement: 06/26/22 Progress towards PT goals: Progressing toward goals    Frequency    7X/week      PT Plan Current plan remains appropriate       AM-PAC PT "6 Clicks" Mobility   Outcome Measure  Help needed turning from your back to your side while in a flat bed without using bedrails?: None Help needed moving from lying on your back to sitting on the side of a flat bed without using bedrails?: A Little Help needed moving to and from a bed to a chair (including a wheelchair)?: A Little Help needed standing up from a chair using your arms (e.g., wheelchair or bedside chair)?: A Little Help needed to walk in hospital room?: A Little Help needed climbing 3-5 steps with a railing? : A Little 6 Click Score: 19    End of Session Equipment Utilized During Treatment: Gait belt Activity Tolerance: Patient tolerated treatment well Patient left: in chair;with call bell/phone within reach;with family/visitor present;Other (comment) (iceman refilled/donned) Nurse Communication:  Mobility status;Patient requests pain meds;Other (comment) (wants rest of his pain meds prior to DC, cleared for DC from PT perspective) PT Visit Diagnosis: Other abnormalities of gait and mobility (R26.89);Muscle weakness (generalized) (M62.81)     Time: 6160-7371 PT Time Calculation (min) (ACUTE ONLY): 35 min  Charges:  $Gait Training: 8-22 mins $Therapeutic Activity: 8-22 mins                     Kasie Leccese P., PTA Acute Rehabilitation Services Secure Chat Preferred 9a-5:30pm Office: [place] 06/13/2022, 11:30 AM

## 2022-06-13 NOTE — Discharge Instructions (Signed)

## 2022-06-14 ENCOUNTER — Encounter (HOSPITAL_COMMUNITY): Payer: Self-pay | Admitting: Orthopaedic Surgery

## 2022-06-15 ENCOUNTER — Other Ambulatory Visit: Payer: Self-pay | Admitting: Orthopaedic Surgery

## 2022-06-15 ENCOUNTER — Telehealth: Payer: Self-pay | Admitting: Radiology

## 2022-06-15 NOTE — Telephone Encounter (Signed)
I called pt. Advised him to take tylenol between for breakthrough pain. Pt stated he feels like he is having trouble controlling pain due to not getting pain medication on time when in the hospital. Michela Pitcher he called for the nurse multiple times but no one came in. FYI Sherri

## 2022-06-15 NOTE — Telephone Encounter (Signed)
Nothing for you. Just FYI on nursing staff. Not sure if you can say or do anything. He just wanted me to tell someone

## 2022-06-15 NOTE — Anesthesia Postprocedure Evaluation (Signed)
Anesthesia Post Note  Patient: Jordan [name]  Procedure(s) Performed: LEFT TOTAL KNEE ARTHROPLASTY (Left: Knee)     Patient location during evaluation: PACU Anesthesia Type: General and Regional Level of consciousness: awake and alert Pain management: pain level controlled Vital Signs Assessment: post-procedure vital signs reviewed and stable Respiratory status: spontaneous breathing, nonlabored ventilation, respiratory function stable and patient connected to nasal cannula oxygen Cardiovascular status: blood pressure returned to baseline and stable Postop Assessment: no apparent nausea or vomiting Anesthetic complications: no   No notable events documented.  Last Vitals:  Vitals:   06/13/22 0602 06/13/22 0810  BP: (!) 140/67   Pulse: 75 73  Resp:    Temp:  36.6 C  SpO2:  99%    Last Pain:  Vitals:   06/13/22 1220  TempSrc:   PainSc: [place]

## 2022-06-15 NOTE — Telephone Encounter (Signed)
Per Anderson Malta, PT with [facility]. Called requesting PT orders for 1week 1, 1week 3, 1 week 2. I gave her the verbal for those.  However patient is complaining of pain in the knee  being 8/10 with pain meds and he is not able to do the exercises and this is even woth taking his pain meds. Please call the patient back to advise.

## 2022-06-21 ENCOUNTER — Other Ambulatory Visit: Payer: Self-pay | Admitting: Orthopaedic Surgery

## 2022-06-25 ENCOUNTER — Other Ambulatory Visit: Payer: Self-pay | Admitting: Orthopaedic Surgery

## 2022-06-26 ENCOUNTER — Ambulatory Visit (INDEPENDENT_AMBULATORY_CARE_PROVIDER_SITE_OTHER): Payer: PRIVATE HEALTH INSURANCE | Admitting: Orthopaedic Surgery

## 2022-06-26 ENCOUNTER — Encounter: Payer: Self-pay | Admitting: Orthopaedic Surgery

## 2022-06-26 DIAGNOSIS — Z96652 Presence of left artificial knee joint: Secondary | ICD-10-CM

## 2022-06-26 IMAGING — DX DG KNEE 1-2V PORT*R*
1 series · 2 of 2 positions shown · non-contrast
Comparison: Radiograph 03/15/2021

CLINICAL DATA: Right total knee replacement

EXAM:
PORTABLE RIGHT KNEE - 1-2 VIEW

[Series 1: knee · 0.14mm/px · 2 of 2 slices shown]
[im 1/2]
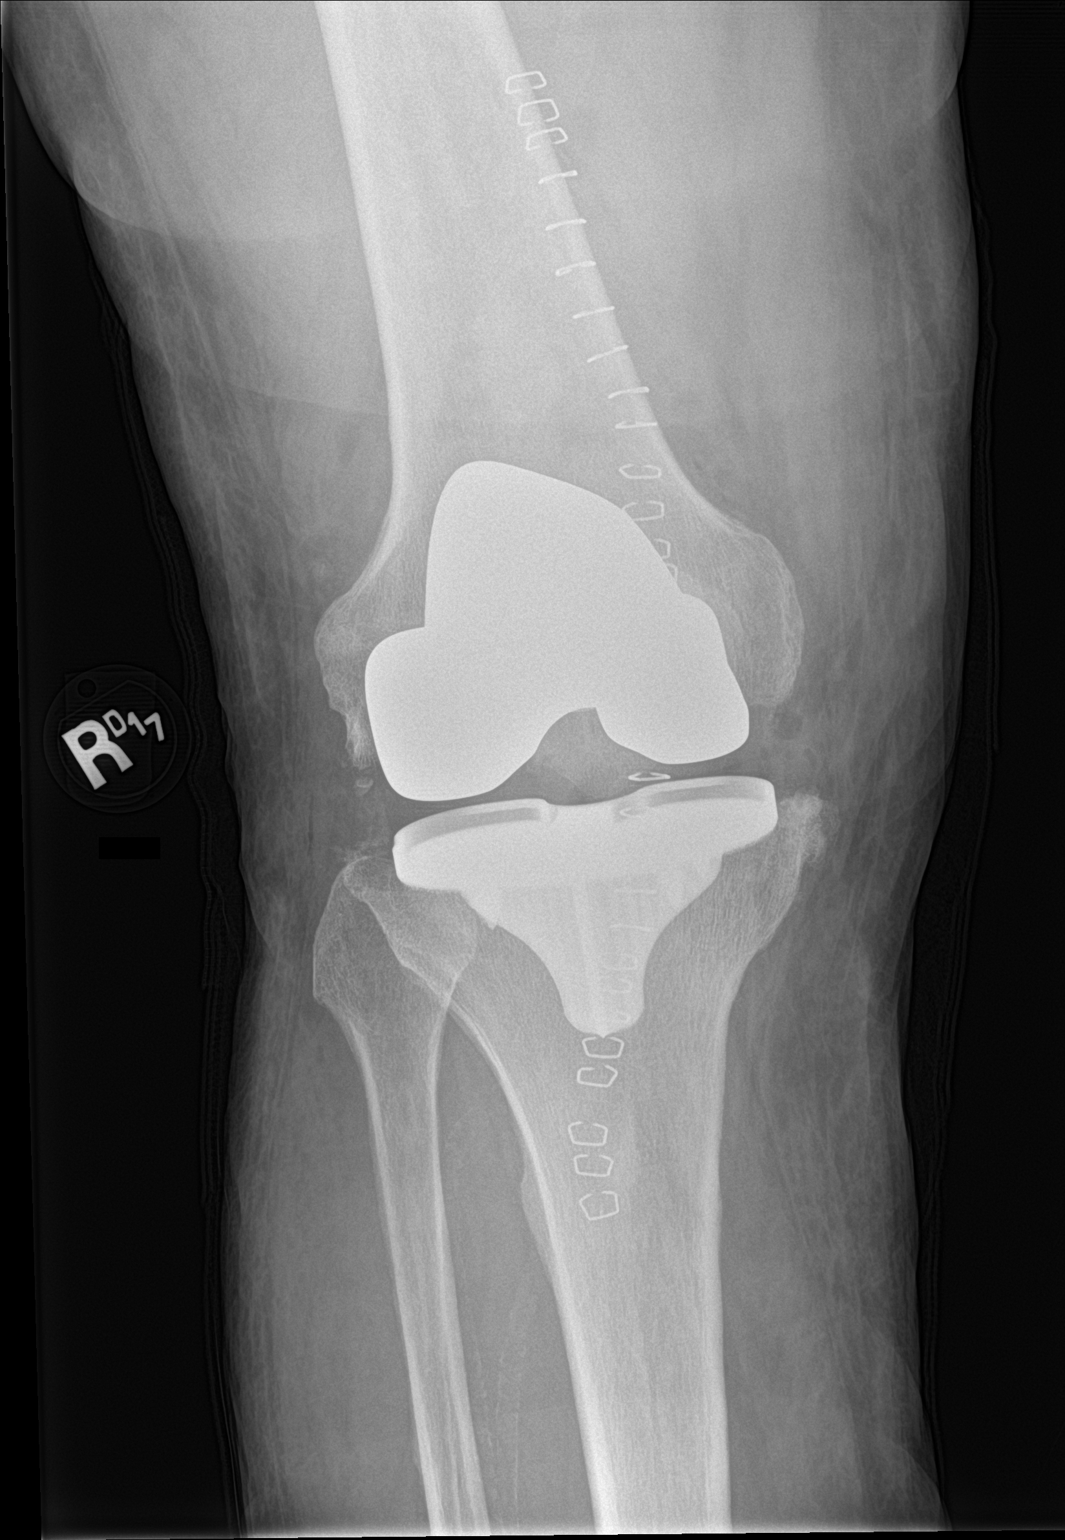
[im 2/2]
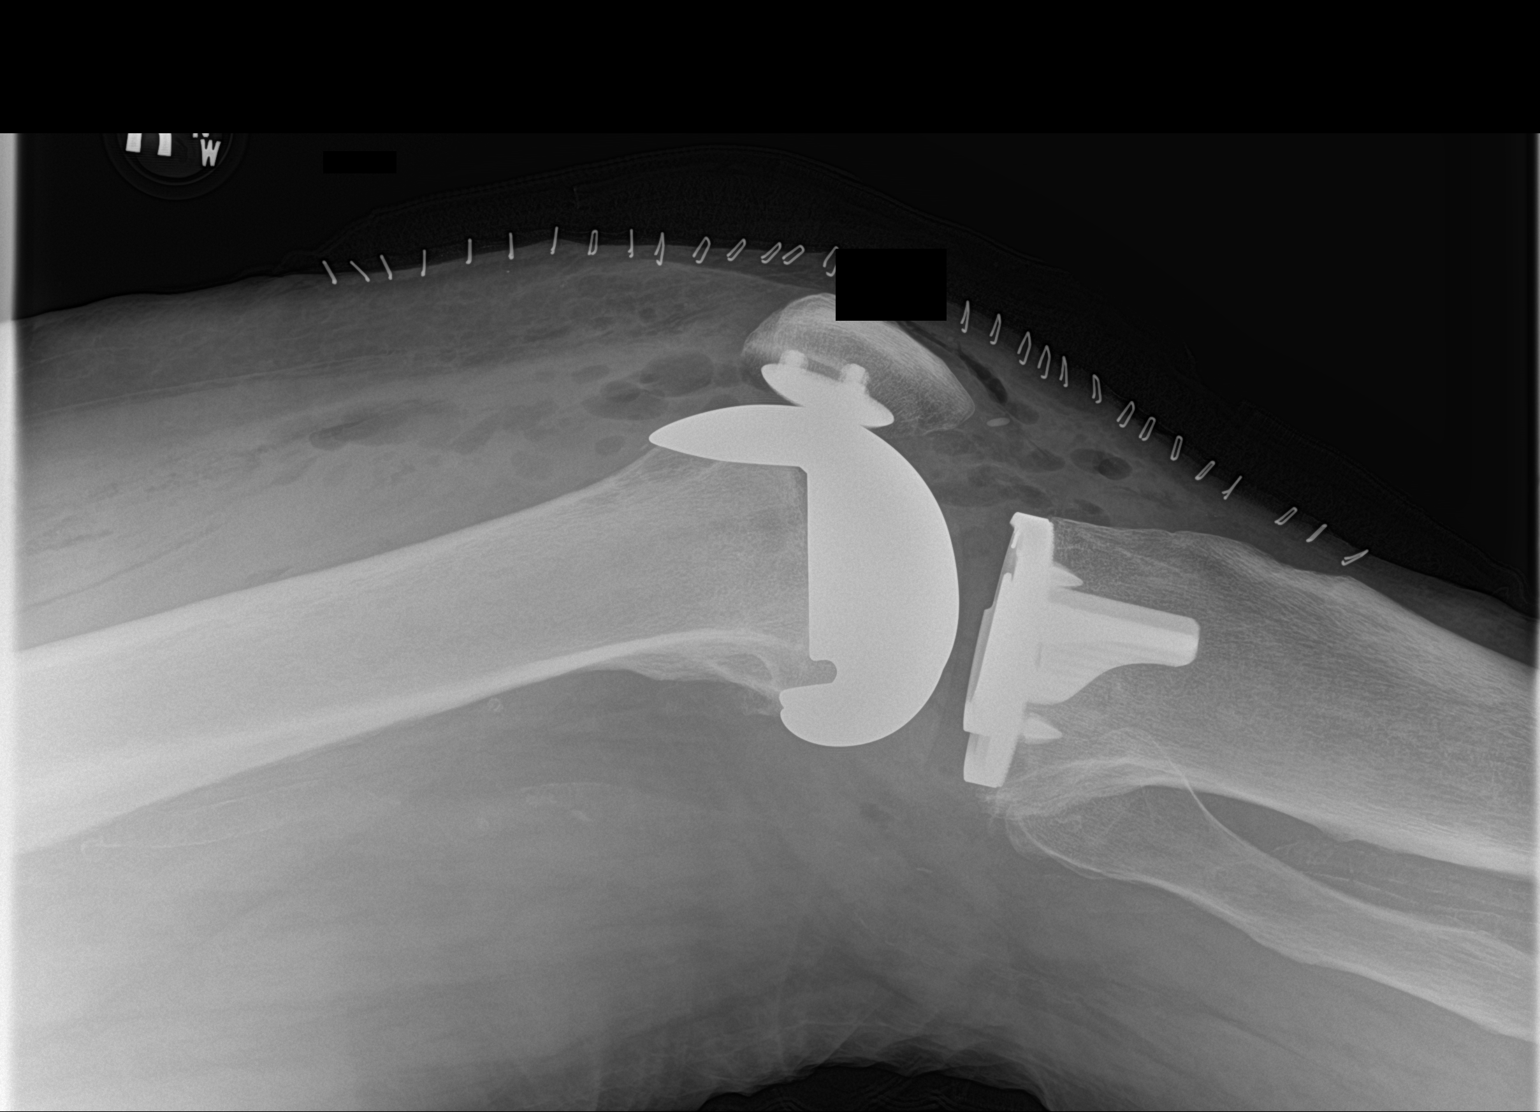

[2 of 2 positions shown; findings below may reference images not displayed]

FINDINGS: There is a total knee arthroplasty in normal alignment without
evidence of loosening or periprosthetic fracture. Expected soft
tissue changes.
IMPRESSION: Total knee arthroplasty without evidence of immediate hardware
complication.

## 2022-06-26 MED ORDER — OXYCODONE HCL 5 MG PO TABS: 5.0000 mg | ORAL_TABLET | Freq: Four times a day (QID) | ORAL | 0 refills | Status: DC | PRN

## 2022-06-26 NOTE — Progress Notes (Signed)
The patient is here for his first postoperative visit status post a left total knee arthroplasty.  He has a remote history of a right knee replacement.  He said they have been able to flex him to between 80 and 90 degrees at home he scheduled for his first evaluation as an outpatient for physical therapy next week.  He does need a refill of oxycodone but he is not taking a lot.  He does have Robaxin as well.  On examination of his left knee his incision looks good.  The staples are removed and Steri-Strips applied.  He has significant swelling of the knee.  His calf, foot and ankle are soft.  His motor and sensory exam are normal.  He lacks full extension by about 3 degrees and I can flex him to only about 85 degrees.  He understands the essential need for continued aggressive therapy to get his knee bending and moving.  He is an active 71 year old gentleman.  I will send in more oxycodone for him.  We will see him back in 4 weeks to see how he is doing overall after course of outpatient physical therapy.  All questions and concerns were answered and addressed.

## 2022-06-27 ENCOUNTER — Telehealth: Payer: Self-pay | Admitting: Orthopaedic Surgery

## 2022-06-27 NOTE — Telephone Encounter (Signed)
Victorino Dike from [facility] called in stating that pt will be discharge from PT today... Victorino Dike stated that pt will being outpatient PT on Tuesday.Marland KitchenMarland Kitchen

## 2022-07-02 ENCOUNTER — Other Ambulatory Visit: Payer: Self-pay | Admitting: Orthopaedic Surgery

## 2022-07-25 ENCOUNTER — Ambulatory Visit (INDEPENDENT_AMBULATORY_CARE_PROVIDER_SITE_OTHER): Payer: PRIVATE HEALTH INSURANCE | Admitting: Orthopaedic Surgery

## 2022-07-25 DIAGNOSIS — Z96652 Presence of left artificial knee joint: Secondary | ICD-10-CM

## 2022-07-25 NOTE — Progress Notes (Signed)
The patient is around 6 weeks status post a left total knee arthroplasty.  We actually replaced his right knee in November 2022 and his right hip in June 2022.  He is 71 years old and active.  He reports good range of motion and strength and he is getting continue physical therapy.  He has stopped all pain medications as well.  His left most recent operative knee does have some swelling to be expected.  His extension is almost full and his flexion is past 90 degrees and getting there.  Overall he does look good.  The knee feels unstable.  He is walking without assistive device.  He will continue therapy and can stop that when he is ready..  From my standpoint, I do not need to see him back for 6 months unless he is having issues.  At that visit we will have a standing AP and lateral of his left more recent operative knee.

## 2023-01-28 ENCOUNTER — Ambulatory Visit: Payer: Medicare Other | Admitting: Orthopaedic Surgery

## 2023-01-28 ENCOUNTER — Other Ambulatory Visit (INDEPENDENT_AMBULATORY_CARE_PROVIDER_SITE_OTHER): Payer: Medicare Other

## 2023-01-28 ENCOUNTER — Encounter: Payer: Self-pay | Admitting: Orthopaedic Surgery

## 2023-01-28 DIAGNOSIS — Z96652 Presence of left artificial knee joint: Secondary | ICD-10-CM

## 2023-01-28 MED ORDER — GABAPENTIN 300 MG PO CAPS: 300.0000 mg | ORAL_CAPSULE | Freq: Every day | ORAL | 0 refills | Status: AC

## 2023-01-28 MED ORDER — METHYLPREDNISOLONE 4 MG PO TABS: ORAL_TABLET | ORAL | 0 refills | Status: AC

## 2023-01-28 NOTE — Progress Notes (Signed)
  The patient is someone well-known to me.  We have replaced both of his knees with the more recently being last October.  Both of these were press-fit implants.  He is 72 years old.  He says the knees are doing well but he does have chronic numbness and tingling in his legs more on the IT band areas on both sides.  He had a history of a lumbar spine fusion.  He has had gabapentin in the past but he said they worked him up to such a strong dose that he did break out and had some hives and that all went away when he stopped Neurontin but he would consider trying it again at night because this is biggest issues is getting sleep at night.  He cannot take anti-inflammatories because he is on Plavix.  He is not a diabetic.  Examination of both knees shows that moves smoothly and fluidly with no complicating features on my exam.  He does have some IT band pain but there is no grinding around the IT band.  The numbness and tingling starts more in his thighs on both sides but it does run to the IT band on both sides.  Both knees again are ligamentously stable on my exam.  A standing AP view and a lateral left knee shows both knees on the AP view.  Both show no complicating features of the implants themselves and are both bone ingrown.  We can try Neurontin again for him but only 300 mg just at bedtime and I will try a steroid taper as well.  Will see him back in a month to see how he is doing overall.  Of note he is already been to physical therapy and I am not sure what else to recommend.  No x-rays are needed at his next visit.

## 2023-03-04 ENCOUNTER — Encounter: Payer: Self-pay | Admitting: Orthopaedic Surgery

## 2023-03-04 ENCOUNTER — Ambulatory Visit: Payer: Medicare Other | Admitting: Orthopaedic Surgery

## 2023-03-04 DIAGNOSIS — Z96651 Presence of right artificial knee joint: Secondary | ICD-10-CM

## 2023-03-04 DIAGNOSIS — Z96641 Presence of right artificial hip joint: Secondary | ICD-10-CM

## 2023-03-04 DIAGNOSIS — Z96652 Presence of left artificial knee joint: Secondary | ICD-10-CM | POA: Diagnosis not present

## 2023-03-04 MED ORDER — MELOXICAM 15 MG PO TABS: 15.0000 mg | ORAL_TABLET | Freq: Every evening | ORAL | 3 refills | Status: AC | PRN

## 2023-03-04 NOTE — Progress Notes (Signed)
The patient is well-known to [place].  He is an active 72 year old gentleman with a history of both his knees being replaced and his right hip being replaced.  He still having some numbness and tingling along the lateral aspect of both knees but is not too uncomfortable for him.  He tried Neurontin at bedtime and he said that did not really help.  Both knees move smoothly and fluidly.  Both knees are ligamentously stable.  He walks with no significant limp.  His right hip also move his well as does his left hip.  We had x-rayed his knees last month and they all looked fine in terms of the alignment and the replacements themselves.  At this point we can try meloxicam at bedtime and see if this will help from an inflammation standpoint.  All questions and concerns were answered addressed.  Follow-up otherwise at this point is as needed.
